# Patient Record
Sex: Female | Born: 1975 | Race: White | Hispanic: No | Marital: Married | Smoking: Former smoker
Health system: Southern US, Academic
[De-identification: ages and names within clinical notes are randomized; demographics above are authoritative.]

## PROBLEM LIST (undated history)

## (undated) DIAGNOSIS — K760 Fatty (change of) liver, not elsewhere classified: Secondary | ICD-10-CM

## (undated) DIAGNOSIS — E119 Type 2 diabetes mellitus without complications: Secondary | ICD-10-CM

## (undated) DIAGNOSIS — IMO0002 Reserved for concepts with insufficient information to code with codable children: Secondary | ICD-10-CM

## (undated) DIAGNOSIS — G4700 Insomnia, unspecified: Secondary | ICD-10-CM

## (undated) DIAGNOSIS — F32A Depression, unspecified: Secondary | ICD-10-CM

## (undated) DIAGNOSIS — J45909 Unspecified asthma, uncomplicated: Secondary | ICD-10-CM

## (undated) DIAGNOSIS — K3184 Gastroparesis: Secondary | ICD-10-CM

## (undated) DIAGNOSIS — M351 Other overlap syndromes: Secondary | ICD-10-CM

## (undated) DIAGNOSIS — E785 Hyperlipidemia, unspecified: Secondary | ICD-10-CM

## (undated) DIAGNOSIS — M797 Fibromyalgia: Secondary | ICD-10-CM

## (undated) DIAGNOSIS — E039 Hypothyroidism, unspecified: Secondary | ICD-10-CM

## (undated) DIAGNOSIS — I10 Essential (primary) hypertension: Secondary | ICD-10-CM

## (undated) DIAGNOSIS — M329 Systemic lupus erythematosus, unspecified: Secondary | ICD-10-CM

## (undated) DIAGNOSIS — Z8679 Personal history of other diseases of the circulatory system: Secondary | ICD-10-CM

## (undated) DIAGNOSIS — K769 Liver disease, unspecified: Secondary | ICD-10-CM

## (undated) DIAGNOSIS — K859 Acute pancreatitis without necrosis or infection, unspecified: Secondary | ICD-10-CM

## (undated) DIAGNOSIS — I7300 Raynaud's syndrome without gangrene: Secondary | ICD-10-CM

## (undated) DIAGNOSIS — G43909 Migraine, unspecified, not intractable, without status migrainosus: Secondary | ICD-10-CM

## (undated) DIAGNOSIS — K589 Irritable bowel syndrome without diarrhea: Secondary | ICD-10-CM

## (undated) DIAGNOSIS — F419 Anxiety disorder, unspecified: Secondary | ICD-10-CM

## (undated) HISTORY — DX: Liver disease, unspecified: K76.9

## (undated) HISTORY — DX: Gastroparesis: K31.84

## (undated) HISTORY — DX: Hyperlipidemia, unspecified: E78.5

## (undated) HISTORY — PX: ABDOMINAL HERNIA REPAIR: SHX539

## (undated) HISTORY — DX: Depression, unspecified: F32.A

## (undated) HISTORY — DX: Migraine, unspecified, not intractable, without status migrainosus: G43.909

## (undated) HISTORY — DX: Raynaud's syndrome without gangrene: I73.00

## (undated) HISTORY — PX: HX MENISCECTOMY: SHX123

## (undated) HISTORY — DX: Systemic lupus erythematosus, unspecified (CMS HCC): M32.9

## (undated) HISTORY — DX: Reserved for concepts with insufficient information to code with codable children: IMO0002

## (undated) HISTORY — PX: HX LUMBAR FUSION: SHX111

## (undated) HISTORY — PX: HX BREAST LUMPECTOMY: SHX2

## (undated) HISTORY — DX: Essential (primary) hypertension: I10

## (undated) HISTORY — DX: Type 2 diabetes mellitus without complications: E11.9

## (undated) HISTORY — DX: Fatty (change of) liver, not elsewhere classified: K76.0

## (undated) HISTORY — DX: Personal history of other diseases of the circulatory system: Z86.79

## (undated) HISTORY — PX: HX GALL BLADDER SURGERY/CHOLE: SHX55

## (undated) HISTORY — PX: ESOPHAGEAL DILATION: SHX303

## (undated) HISTORY — DX: Hypothyroidism, unspecified: E03.9

## (undated) HISTORY — PX: REPLACEMENT TOTAL KNEE: SUR1224

## (undated) HISTORY — DX: Acute pancreatitis without necrosis or infection, unspecified: K85.90

## (undated) HISTORY — DX: Type 2 diabetes mellitus without complications (CMS HCC): E11.9

## (undated) HISTORY — PX: HX TUBAL LIGATION: SHX77

## (undated) HISTORY — DX: Anxiety disorder, unspecified: F41.9

## (undated) HISTORY — DX: Unspecified asthma, uncomplicated: J45.909

## (undated) HISTORY — PX: HEEL SPUR SURGERY: SHX665

## (undated) HISTORY — DX: Irritable bowel syndrome, unspecified: K58.9

## (undated) HISTORY — PX: HX HYSTERECTOMY: SHX81

## (undated) HISTORY — DX: Insomnia, unspecified: G47.00

## (undated) HISTORY — DX: Fibromyalgia: M79.7

---
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
## 2019-08-16 IMAGING — CR XRAY LUMBAR SPINE COMPLETE
1 series · 5 of 5 positions shown · non-contrast
Comparison: None available.

EXAM:  XRAY LUMBAR SPINE COMPLETE
INDICATION: Back pain.

[Series 5: view not recorded · 0.17mm/px · 5 of 5 slices shown]
[im 1/5]
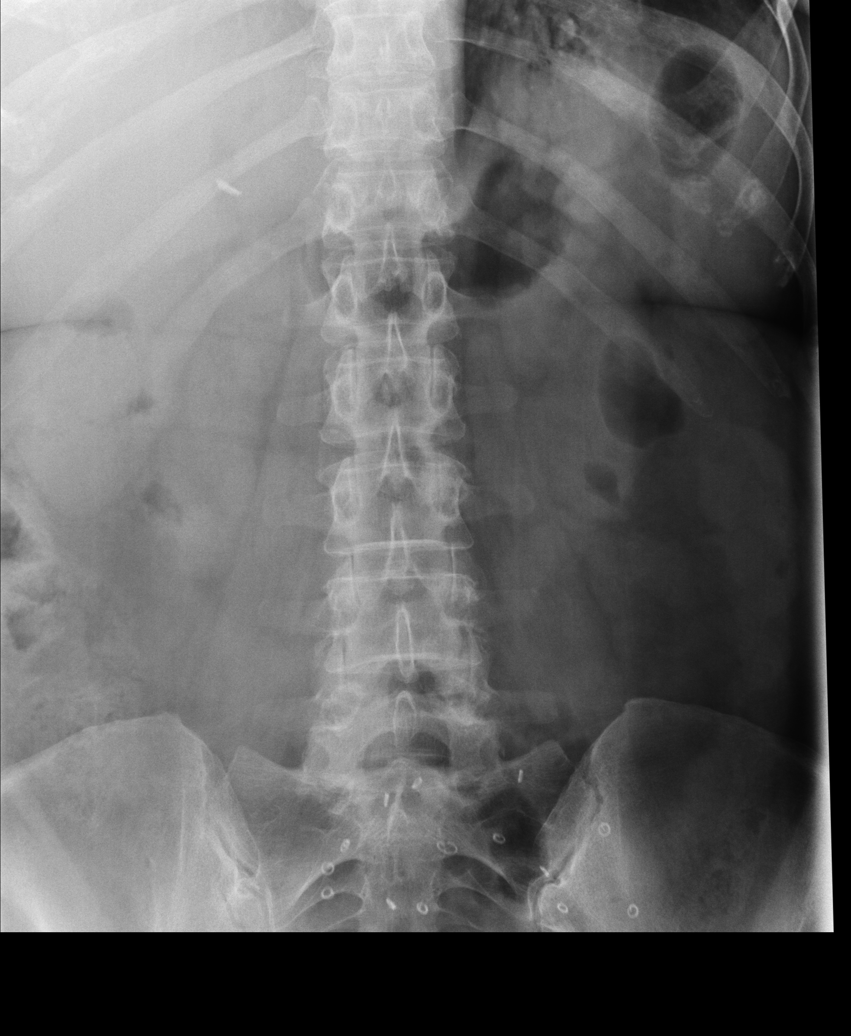
[im 2/5]
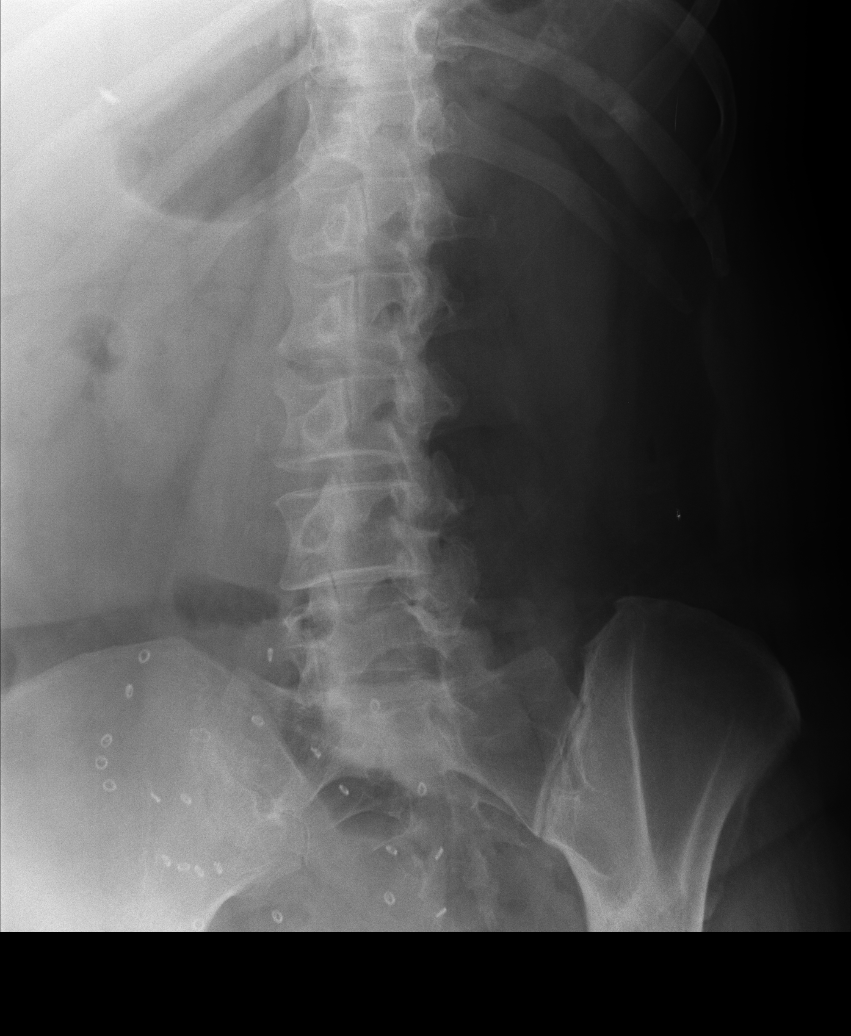
[im 3/5]
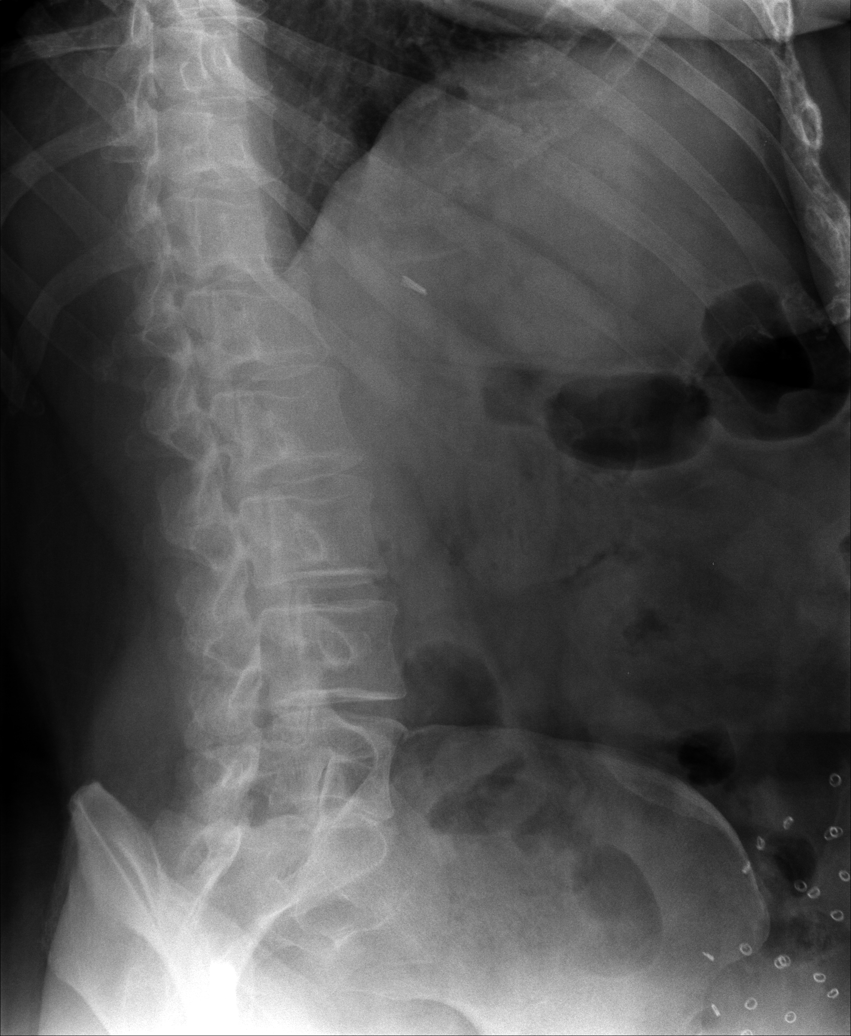
[im 4/5]
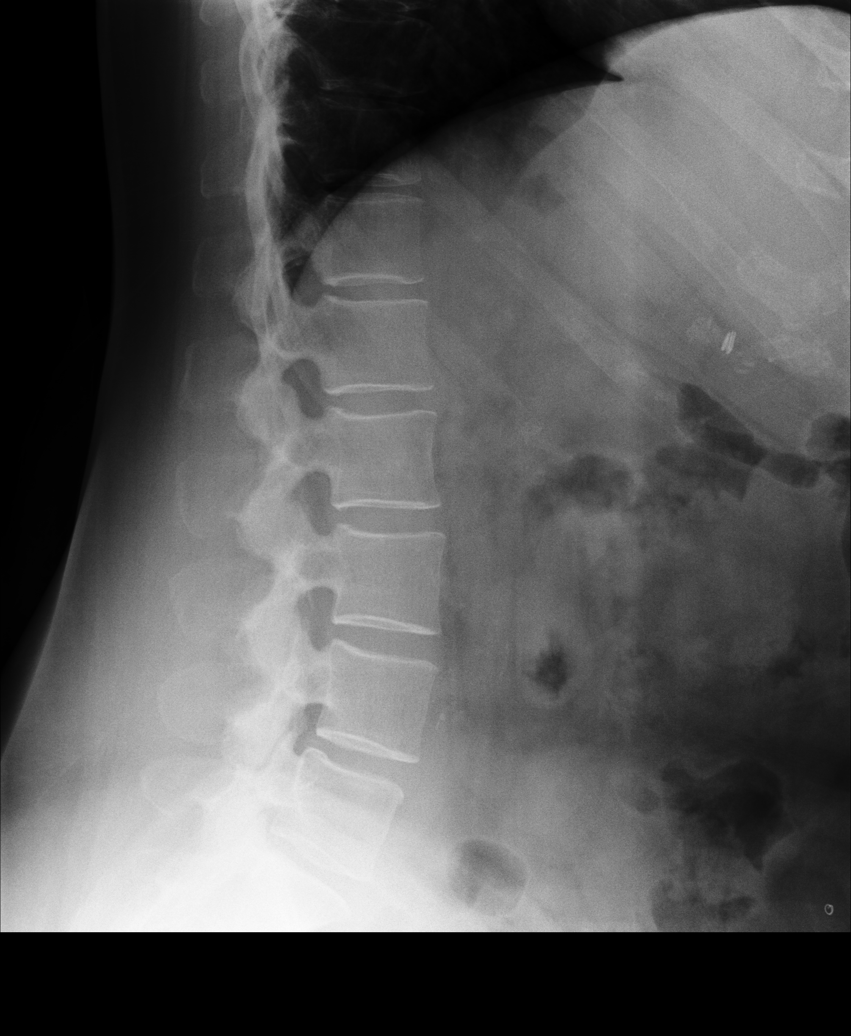
[im 5/5]
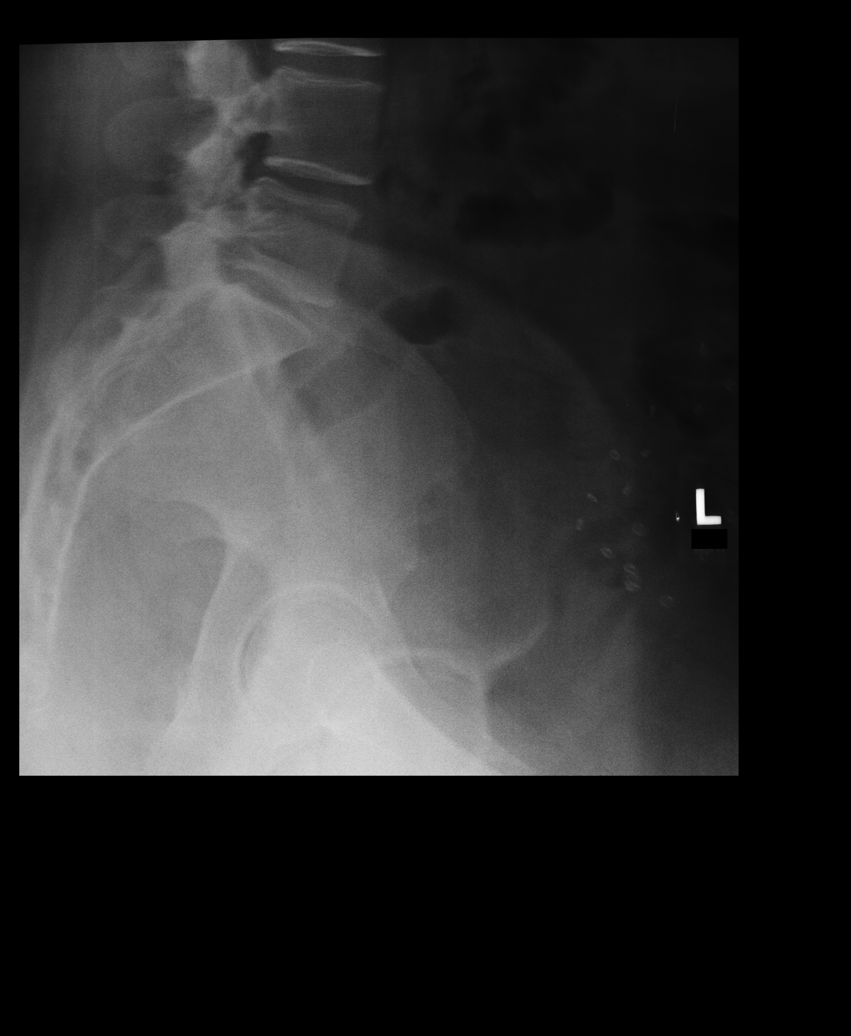

[5 of 5 positions shown; findings below may reference images not displayed]

FINDINGS: Vertebral bodies are normal in height and alignment. There is no acute fracture or subluxation. No significant disc space narrowing is seen at any level. There is moderate facet arthropathy within the lower lumbar spine. There is no definite pars defect on the oblique views. Paraspinal soft tissues are unremarkable. Cholecystectomy clips and ventral hernia mesh repair are also noted.
IMPRESSION: Moderate facet arthropathy within the lower lumbar spine.

## 2019-10-23 IMAGING — MR MRI LUMBAR SPINE WITHOUT CONTRAST
6 series · 43 of 48 positions shown · IV contrast (gadolinium)
Comparison: Radiographs dated 08/16/2019.

EXAM:  MRI LUMBAR SPINE WITHOUT CONTRAST
INDICATION: Chronic lower back pain.
TECHNIQUE: Multiplanar multisequential MRI of the lumbar spine was performed without gadolinium contrast.

[Series 9: STIR · sagittal · 4.0mm · 1.05mm/px · 3 of 13 slices shown]
[im 1/13]
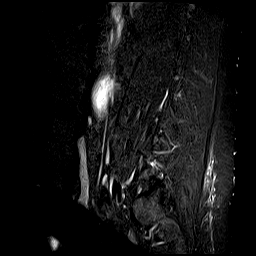
[im 4/13]
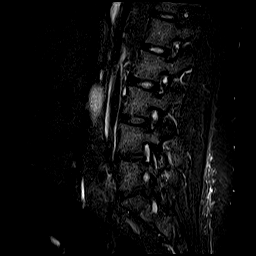
[im 7/13]
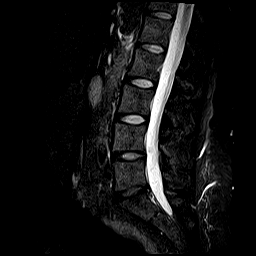

[Series 10: T2 · sagittal · 4.0mm · 0.94mm/px · 6 of 13 slices shown (1 of 3)]
[im 1/13]
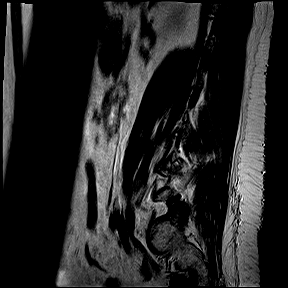
[im 3/13]
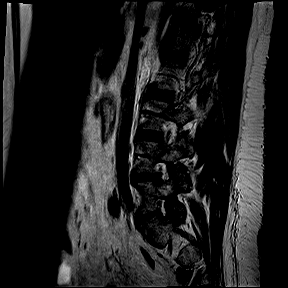
[im 5/13]
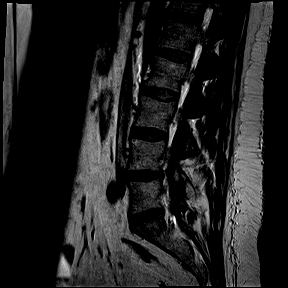
[im 8/13]
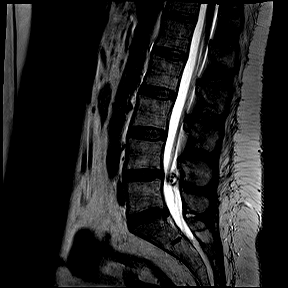
[im 10/13]
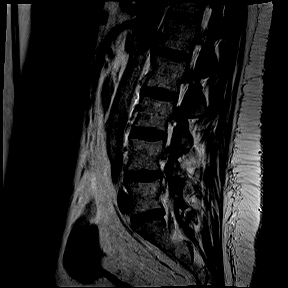
[im 13/13]
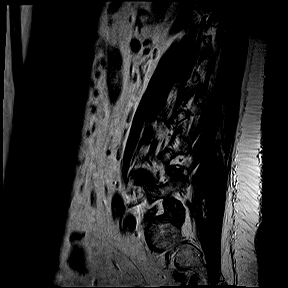

[Series 11: T1 · sagittal · 4.0mm · 0.94mm/px · 6 of 13 slices shown (1 of 2)]
[im 1/13]
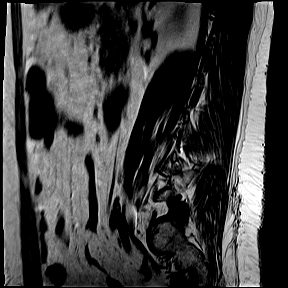
[im 3/13]
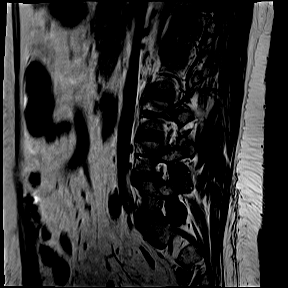
[im 5/13]
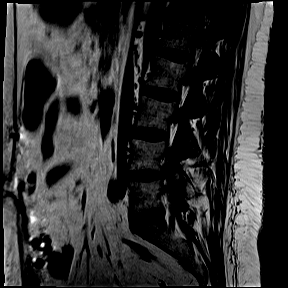
[im 8/13]
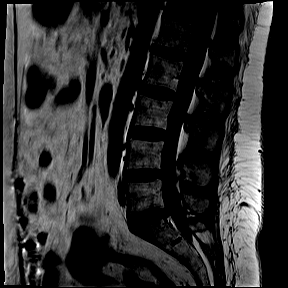
[im 10/13]
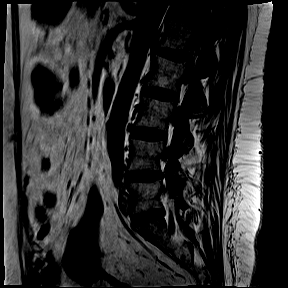
[im 13/13]
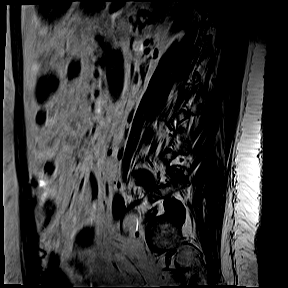

[Series 14: T2 · coronal · 4.0mm · 0.90mm/px · 9 of 20 slices shown (2 of 3)]
[im 1/20]
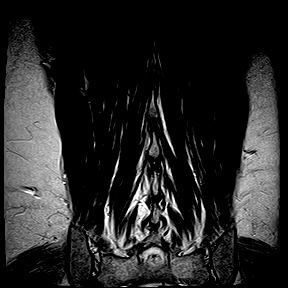
[im 3/20]
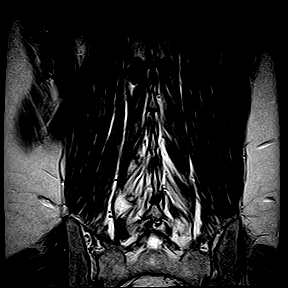
[im 5/20]
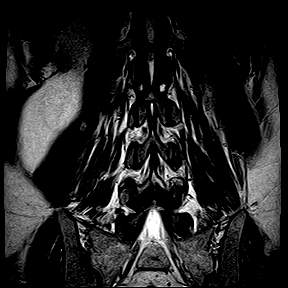
[im 8/20]
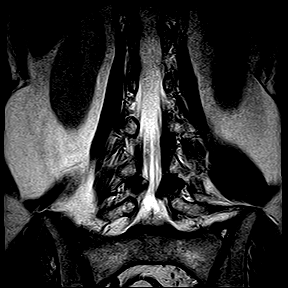
[im 10/20]
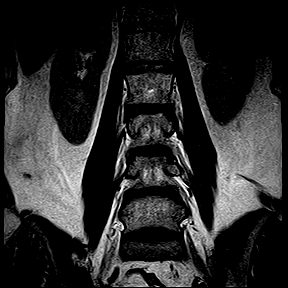
[im 12/20]
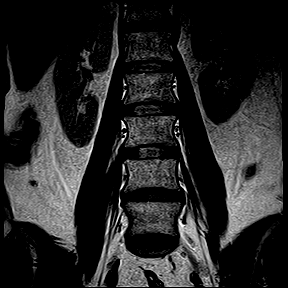
[im 15/20]
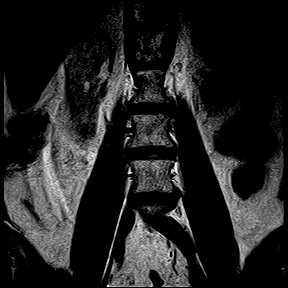
[im 17/20]
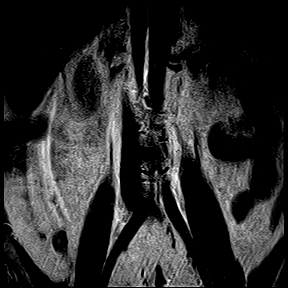
[im 20/20]
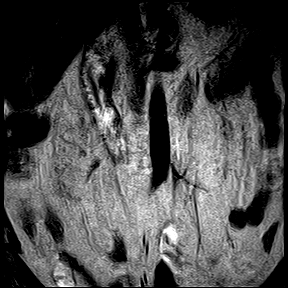

[Series 15: T1 · axial · 4.0mm · 0.47mm/px · z∈[-95,+117]mm · 8 of 23 slices shown (2 of 2)]
[im 1/23]
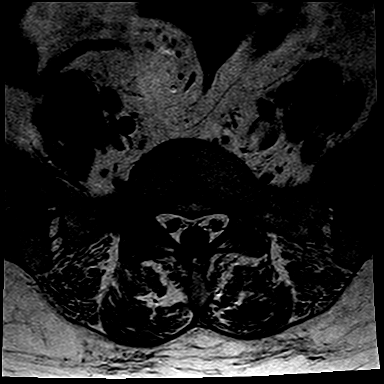
[im 5/23]
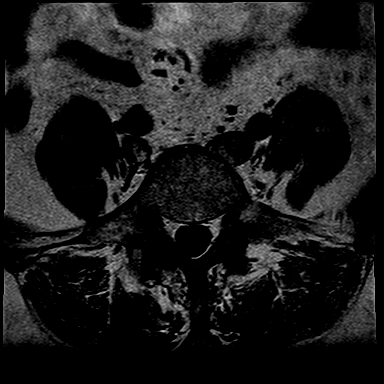
[im 7/23]
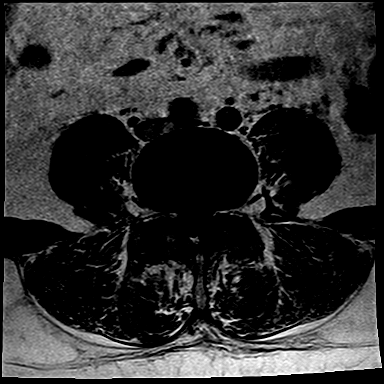
[im 9/23]
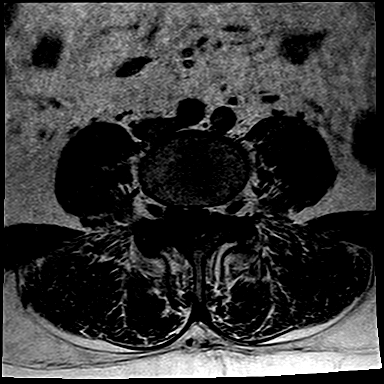
[im 14/23]
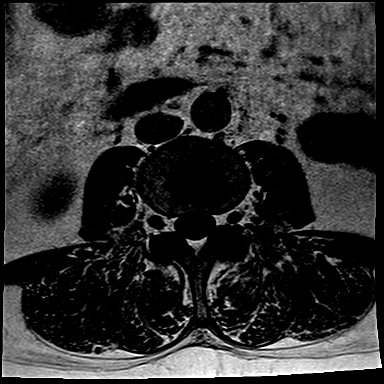
[im 16/23]
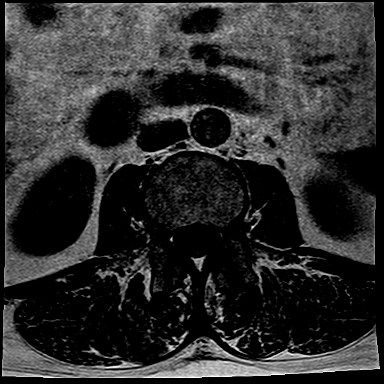
[im 18/23]
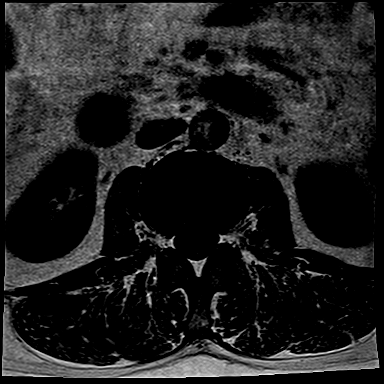
[im 23/23]
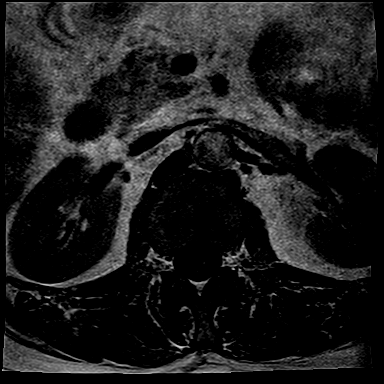

[Series 16: T2 · axial · 4.0mm · 0.47mm/px · z∈[-95,+117]mm · 11 of 23 slices shown (3 of 3)]
[im 1/23]
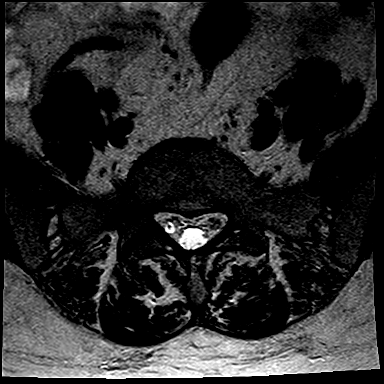
[im 3/23]
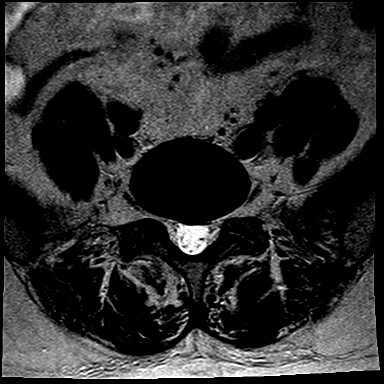
[im 5/23]
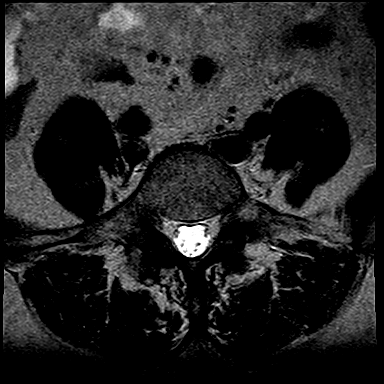
[im 7/23]
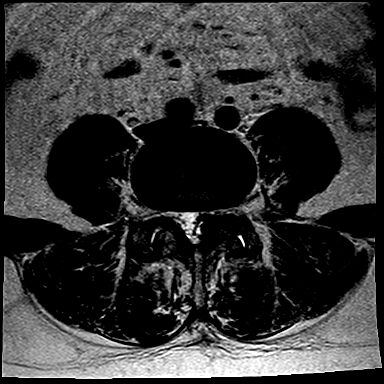
[im 9/23]
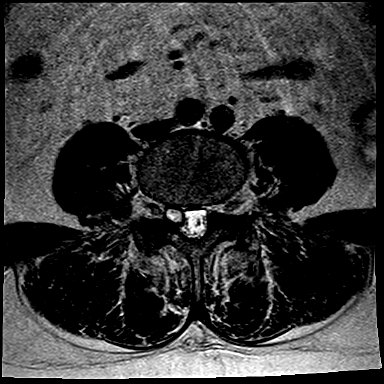
[im 12/23]
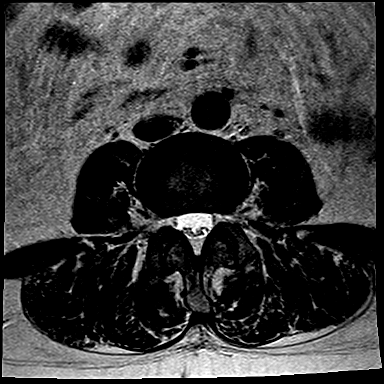
[im 14/23]
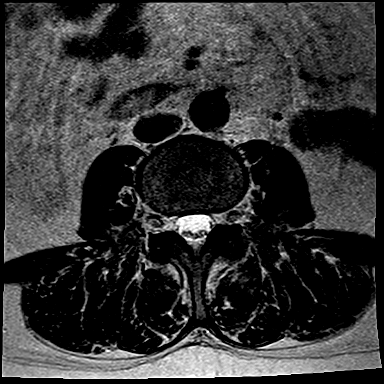
[im 16/23]
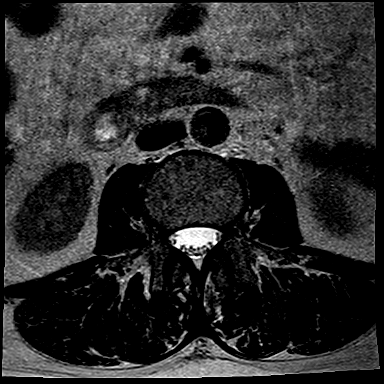
[im 18/23]
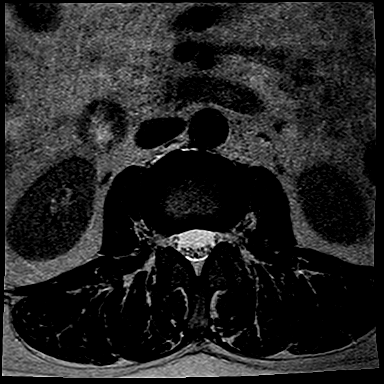
[im 20/23]
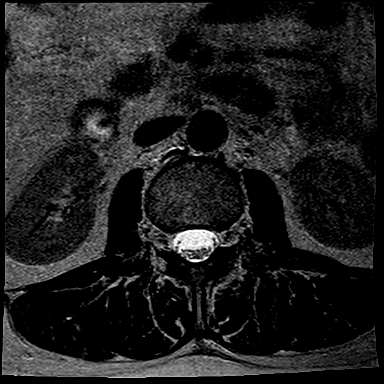
[im 23/23]
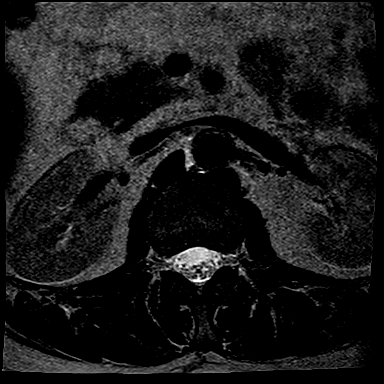

[43 of 48 positions shown; findings below may reference images not displayed]

FINDINGS: Vertebral bodies are normal in height, alignment and signal intensity. There is no acute fracture or subluxation. Distal spinal cord is normal in signal intensity and terminates normally at L1 vertebral body level. Spinal canal is congenitally narrow.

L1-2, L2-3 and L3-4 levels are unremarkable.

At L4-5 level, there is a minimal bulging annulus, minimally effacing the ventral thecal sac. There is severe lateral recess compression bilaterally from facet arthropathy. There is also a 9 mm synovial cyst associated with the right facet joint, contributing to severe right lateral recess compression. There is also moderate bilateral neural foraminal stenosis from facet arthropathy and bulging annulus.

At L5-S1 level, there is a small broad-based central disc bulge, mildly abutting the ventral thecal sac. There is mild-to-moderate bilateral neural foraminal stenosis from facet arthropathy.

Paraspinal soft tissues are unremarkable.
IMPRESSION: No significant disc herniation or spinal stenosis at any level. 

Advanced facet joint osteoarthritis bilaterally at L4-5 level. There is also a 9 mm synovial cyst associated with the right facet joint at this level. These findings result in severe lateral recess compression bilaterally at this level.

## 2019-11-26 ENCOUNTER — Ambulatory Visit (HOSPITAL_COMMUNITY): Payer: Self-pay

## 2021-03-31 ENCOUNTER — Encounter (INDEPENDENT_AMBULATORY_CARE_PROVIDER_SITE_OTHER): Payer: Self-pay | Admitting: HEMATOLOGY/ONCOLOGY

## 2021-04-16 ENCOUNTER — Other Ambulatory Visit: Payer: Self-pay

## 2021-04-16 ENCOUNTER — Ambulatory Visit (INDEPENDENT_AMBULATORY_CARE_PROVIDER_SITE_OTHER): Payer: Medicaid Other | Admitting: Family

## 2021-04-16 ENCOUNTER — Encounter (INDEPENDENT_AMBULATORY_CARE_PROVIDER_SITE_OTHER): Payer: Self-pay | Admitting: Family

## 2021-04-16 VITALS — BP 129/84 | HR 109 | Temp 97.3°F | Ht 71.5 in | Wt 233.7 lb

## 2021-04-16 DIAGNOSIS — F32A Depression, unspecified: Secondary | ICD-10-CM

## 2021-04-16 DIAGNOSIS — K760 Fatty (change of) liver, not elsewhere classified: Secondary | ICD-10-CM

## 2021-04-16 DIAGNOSIS — E119 Type 2 diabetes mellitus without complications: Secondary | ICD-10-CM

## 2021-04-16 DIAGNOSIS — Z8679 Personal history of other diseases of the circulatory system: Secondary | ICD-10-CM

## 2021-04-16 DIAGNOSIS — J45909 Unspecified asthma, uncomplicated: Secondary | ICD-10-CM

## 2021-04-16 DIAGNOSIS — I7300 Raynaud's syndrome without gangrene: Secondary | ICD-10-CM

## 2021-04-16 DIAGNOSIS — E785 Hyperlipidemia, unspecified: Secondary | ICD-10-CM

## 2021-04-16 DIAGNOSIS — I10 Essential (primary) hypertension: Secondary | ICD-10-CM

## 2021-04-16 DIAGNOSIS — M329 Systemic lupus erythematosus, unspecified: Secondary | ICD-10-CM

## 2021-04-16 DIAGNOSIS — K3184 Gastroparesis: Secondary | ICD-10-CM

## 2021-04-16 DIAGNOSIS — D72829 Elevated white blood cell count, unspecified: Secondary | ICD-10-CM

## 2021-04-16 DIAGNOSIS — Z6832 Body mass index (BMI) 32.0-32.9, adult: Secondary | ICD-10-CM

## 2021-04-16 DIAGNOSIS — G43909 Migraine, unspecified, not intractable, without status migrainosus: Secondary | ICD-10-CM

## 2021-04-16 DIAGNOSIS — K859 Acute pancreatitis without necrosis or infection, unspecified: Secondary | ICD-10-CM

## 2021-04-16 DIAGNOSIS — G4700 Insomnia, unspecified: Secondary | ICD-10-CM

## 2021-04-16 DIAGNOSIS — F419 Anxiety disorder, unspecified: Secondary | ICD-10-CM

## 2021-04-16 DIAGNOSIS — M797 Fibromyalgia: Secondary | ICD-10-CM

## 2021-04-16 DIAGNOSIS — E039 Hypothyroidism, unspecified: Secondary | ICD-10-CM

## 2021-04-16 DIAGNOSIS — D7289 Other specified disorders of white blood cells: Secondary | ICD-10-CM

## 2021-04-16 NOTE — H&P (Unsigned)
HEMATOLOGY/ONCOLOGY, [facility]  [place], [place] [address]      New Patient History and Physical     Name: Kathy Gibson MRN:  [id number]   Date: 04/16/2021 Age: 45 y.o. DOB: 1976/04/18       Referring Physician: Cam Hai, FNP  Primary Care Provider: Cam Hai, FNP    Reason for visit/consultation:   Abnormal results (Elevated white count )        History of Present Illness:  The patient is a 45 year old white female that was referred to [place] for leukocytosis.  Patient states she was 1st aware that her WBCs were elevated last year.  She states at that time her WBCs was 12.8.  Patient states that she has had poor dentition for years and just had all her teeth removed last week and is preparing to be fitted for dentures.  Patient states she had labs done in May 2022 which revealed an elevated WBC count 14.1.  Patient states she has had no follow-up CBC done since that time and since she has had her oral surgery performed.  She denies any fevers, sweats, or weight loss.  She is a former smoker.  She denies any recent use of steroids.  She denies any history of blood disorders personally or in her family.        Past Medical History  Past Medical History:   Diagnosis Date   . Anxiety and depression    . Asthma    . Diabetes mellitus, type 2 (CMS HCC)    . Essential hypertension    . Fatty liver    . Fibromyalgia    . Gastroparesis    . History of angina    . Hyperlipidemia    . Hypothyroidism    . IBS (irritable bowel syndrome)    . Insomnia    . Liver disease     fatty liver   . Lupus (CMS HCC)    . Migraines    . Pancreatitis    . Raynaud disease       Past Surgical History:   Procedure Laterality Date   . ABDOMINAL HERNIA REPAIR     . CESAREAN SECTION     . ESOPHAGEAL DILATION     . HEEL SPUR SURGERY     . HX BREAST LUMPECTOMY Left     benign   . HX CHOLECYSTECTOMY     . HX HYSTERECTOMY     . HX LUMBAR FUSION     . HX MENISCECTOMY     .  HX TUBAL LIGATION     . REPLACEMENT TOTAL KNEE        Family Medical History:     Problem Relation (Age of Onset)    Breast Cancer Maternal Grandmother, Paternal Grandmother    Cervical Cancer Mother    Heart Attack Paternal Grandmother    Stroke Maternal Grandmother          Social History  Occupation:   Social History     Occupational History   . Not on file    [place] [id number]  Social History     Socioeconomic History   . Marital status: Married   Tobacco Use   . Smoking status: Former Smoker     Packs/day: 0.50     Years: 12.00     Pack years: 6.00     Types: Cigarettes     Quit date:  2017     Years since quitting: 5.4   . Smokeless tobacco: Never Used   Vaping Use   . Vaping Use: Former   . Substances: THC   Substance and Sexual Activity   . Alcohol use: Not Currently   . Drug use: Yes     Types: Marijuana     Comment: legal pen to help with teeth pain       Allergies   Allergen Reactions   . Cefdinir    . Demerol [Meperidine]    . Doxycycline    . Morphine    . Nsaids (Non-Steroidal Anti-Inflammatory Drug)      Current Outpatient Medications   Medication Sig   . ACCU-CHEK GUIDE TEST STRIPS Does not apply Strip    . ACCU-CHEK SOFTCLIX LANCETS Misc    . amLODIPine (NORVASC) 5 mg Oral Tablet    . aspirin (ECOTRIN) 81 mg Oral Tablet, Delayed Release (E.C.)    . atorvastatin (LIPITOR) 40 mg Oral Tablet    . BD INSULIN PEN NEEDLE UF 31 gauge x 5/16" Needle    . bethanechol chloride (URECHOLINE) 5 mg Oral Tablet    . clonazePAM (KLONOPIN) 0.5 mg Oral Tablet    . cyanocobalamin (VITAMIN B12) 1,000 mcg/mL Injection Solution    . dicyclomine (BENTYL) 10 mg Oral Capsule    . DULoxetine (CYMBALTA DR) 30 mg Oral Capsule, Delayed Release(E.C.)    . ergocalciferol, vitamin D2, (DRISDOL) 1,250 mcg (50,000 unit) Oral Capsule    . famotidine (PEPCID) 40 mg Oral Tablet    . FREESTYLE LANCETS 28 gauge Misc    . hydrOXYchloroQUINE (PLAQUENIL) 200 mg Oral Tablet    . isosorbide mononitrate (IMDUR) 30 mg Oral Tablet  Sustained Release 24 hr    . JARDIANCE 25 mg Oral Tablet    . LANTUS SOLOSTAR U-100 INSULIN 100 unit/mL (3 mL) Subcutaneous Insulin Pen    . levothyroxine (SYNTHROID) 150 mcg Oral Tablet    . Magnesium Gluconate (MAG-G) 27 mg magnesium (500 mg) Oral Tablet Take by mouth   . MetFORMIN (GLUCOPHAGE) 1,000 mg Oral Tablet    . metoprolol succinate (TOPROL-XL) 25 mg Oral Tablet Sustained Release 24 hr    . NANO PEN NEEDLE 32 gauge x 5/32" Needle    . nitroGLYCERIN (NITROSTAT) 0.4 mg Sublingual Tablet, Sublingual    . NOVOLOG FLEXPEN U-100 INSULIN 100 unit/mL (3 mL) Subcutaneous Insulin Pen    . omeprazole (PRILOSEC) 40 mg Oral Capsule, Delayed Release(E.C.)    . TRULICITY 3 CH/8.8 mL Subcutaneous Pen Injector    . ZENPEP 40,000-126,000- 168,000 unit Oral Capsule, Delayed Release(E.C.)        ROS:   Review of Systems   Constitutional: Positive for fatigue.   HENT:   Positive for sore throat and trouble swallowing.    Cardiovascular: Positive for chest pain.   Gastrointestinal: Positive for nausea and vomiting.   Endocrine: Positive for hot flashes.   Musculoskeletal: Positive for arthralgias.   Neurological: Positive for dizziness.       Physical Examination:  BP 129/84 (Site: Right, Patient Position: Sitting, Cuff Size: Adult)   Pulse (!) 109   Temp 36.3 C (97.3 F) (Skin)   Ht 1.816 m (5' 11.5")   Wt 106 kg (233 lb 11.2 oz)   BMI 32.14 kg/m       Physical Exam  Vitals and nursing note reviewed.   Constitutional:       Appearance: Normal appearance.   HENT:  Head: Normocephalic.      Mouth/Throat:      Mouth: Mucous membranes are moist.   Eyes:      General: No scleral icterus.     Extraocular Movements: Extraocular movements intact.   Cardiovascular:      Rate and Rhythm: Normal rate and regular rhythm.      Heart sounds: Normal heart sounds, S1 normal and S2 normal.   Pulmonary:      Effort: Pulmonary effort is normal.      Breath sounds: Normal breath sounds.   Abdominal:      General: Bowel sounds are  normal.      Palpations: Abdomen is soft.   Musculoskeletal:      Cervical back: Normal range of motion and neck supple. No bony tenderness.      Thoracic back: No bony tenderness.      Lumbar back: No bony tenderness.   Lymphadenopathy:      Cervical: No cervical adenopathy.      Comments: No supraclavicular adenopathy   Skin:     General: Skin is warm and dry.   Neurological:      Mental Status: She is alert.      Motor: Motor function is intact.      Coordination: Coordination is intact.      Gait: Gait is intact.   Psychiatric:         Mood and Affect: Mood and affect normal.         Speech: Speech normal.         Behavior: Behavior is cooperative.         Thought Content: Thought content normal.         Cognition and Memory: Cognition and memory normal.          Data reviewed:   Labs from 11/28/2019:  WBC 9.3, hemoglobin 13.1, platelets 319000, neutrophils 63, lymphs 29, monos 4, eos 3, baso 1, CRP 18, ANA positive total bili 0.3, alk-phos 168, AST 19, ALT 20    Labs from 02/25/2021:  WBC 14.1, hemoglobin 14.0, platelets 409000, neutrophils 9.6, absolute lymphs 3.3, monos 0.9, eos 0.2, basos 0.1 total bili 0.4, alk-phos 158, AST 18, ALT 19      Assessment/Plan:   Problem List Items Addressed This Visit        Hematologic/Lymphatic    Leucocytosis - Primary     We will send patient for a CBC with diff, CMP, lap score, sed rate, CRP, and ultrasound of the spleen.  We will ask pathology to review the peripheral smear.  Most recently the reagent for the lap score has been on national back order.  We will attempt to pre-cert a bcr-ABL with her insurance if that remains the case.  At this time leukocytosis appears to be related to her poor dentition.  She has had all her teeth removed and is currently waiting on dentures.  We will see if her WBC has improved with the labs she is having drawn today.  We will plan to see her in follow-up in 5 weeks to review the results and discuss the next plan of care.            Relevant Orders    CBC/DIFF    COMPREHENSIVE METABOLIC PANEL, NON-FASTING    LEUKOCYTE ALKALINE PHOSPHATASE    SEDIMENTATION RATE    C-REACTIVE PROTEIN(CRP),INFLAMMATION    GIEMSA STAIN, WITH PATHOLOGIST REVIEW    US SPLEEN    Other specified disorders of white  blood cells    Relevant Orders    BCR-ABL1 GENE REARRANGEMENT QUANT PCR    Lupus (CMS HCC)       Cardiovascular System    Essential hypertension    History of angina    Raynaud disease    Hyperlipidemia       Respiratory    Asthma       Neurologic    Fibromyalgia    Migraines    Insomnia       Digestive    Fatty liver    Gastroparesis       Endocrine    Diabetes mellitus, type 2 (CMS HCC)    Pancreatitis    Hypothyroidism       Psychiatric    Anxiety and depression          Return in about 5 weeks (around 05/21/2021) for In Person Visit.    Caralina Nop Environmental education officer, FNP-C      CC:  Lilia Pro, FNP  [address]  [place] [address]      Portions of this note may be dictated using voice recognition software or a dictation service. Variances in spelling and vocabulary are possible and unintentional. Not all errors are caught/corrected. Please notify the [facility] if any discrepancies are noted or if the meaning of any statement is not clear.

## 2021-04-16 NOTE — Nursing Note (Signed)
NP referral for elevated white count

## 2021-04-20 ENCOUNTER — Encounter (INDEPENDENT_AMBULATORY_CARE_PROVIDER_SITE_OTHER): Payer: Self-pay | Admitting: Family

## 2021-04-20 DIAGNOSIS — I10 Essential (primary) hypertension: Secondary | ICD-10-CM | POA: Insufficient documentation

## 2021-04-20 DIAGNOSIS — E785 Hyperlipidemia, unspecified: Secondary | ICD-10-CM | POA: Insufficient documentation

## 2021-04-20 DIAGNOSIS — K760 Fatty (change of) liver, not elsewhere classified: Secondary | ICD-10-CM | POA: Insufficient documentation

## 2021-04-20 DIAGNOSIS — G43909 Migraine, unspecified, not intractable, without status migrainosus: Secondary | ICD-10-CM | POA: Insufficient documentation

## 2021-04-20 DIAGNOSIS — D72829 Elevated white blood cell count, unspecified: Secondary | ICD-10-CM | POA: Insufficient documentation

## 2021-04-20 DIAGNOSIS — K859 Acute pancreatitis without necrosis or infection, unspecified: Secondary | ICD-10-CM | POA: Insufficient documentation

## 2021-04-20 DIAGNOSIS — Z8679 Personal history of other diseases of the circulatory system: Secondary | ICD-10-CM | POA: Insufficient documentation

## 2021-04-20 DIAGNOSIS — K3184 Gastroparesis: Secondary | ICD-10-CM | POA: Insufficient documentation

## 2021-04-20 DIAGNOSIS — E119 Type 2 diabetes mellitus without complications: Secondary | ICD-10-CM | POA: Insufficient documentation

## 2021-04-20 DIAGNOSIS — E7849 Other hyperlipidemia: Secondary | ICD-10-CM | POA: Insufficient documentation

## 2021-04-20 DIAGNOSIS — M329 Systemic lupus erythematosus, unspecified: Secondary | ICD-10-CM | POA: Insufficient documentation

## 2021-04-20 DIAGNOSIS — E039 Hypothyroidism, unspecified: Secondary | ICD-10-CM | POA: Insufficient documentation

## 2021-04-20 DIAGNOSIS — F32A Depression, unspecified: Secondary | ICD-10-CM | POA: Insufficient documentation

## 2021-04-20 DIAGNOSIS — D7289 Other specified disorders of white blood cells: Secondary | ICD-10-CM | POA: Insufficient documentation

## 2021-04-20 DIAGNOSIS — I7300 Raynaud's syndrome without gangrene: Secondary | ICD-10-CM | POA: Insufficient documentation

## 2021-04-20 DIAGNOSIS — J45909 Unspecified asthma, uncomplicated: Secondary | ICD-10-CM | POA: Insufficient documentation

## 2021-04-20 DIAGNOSIS — Z794 Long term (current) use of insulin: Secondary | ICD-10-CM | POA: Insufficient documentation

## 2021-04-20 DIAGNOSIS — G4700 Insomnia, unspecified: Secondary | ICD-10-CM | POA: Insufficient documentation

## 2021-04-20 DIAGNOSIS — M797 Fibromyalgia: Secondary | ICD-10-CM | POA: Insufficient documentation

## 2021-04-20 NOTE — Assessment & Plan Note (Addendum)
We will send patient for a CBC with diff, CMP, lap score, sed rate, CRP, and ultrasound of the spleen.  We will ask pathology to review the peripheral smear.  Most recently the reagent for the lap score has been on national back order.  We will attempt to pre-cert a bcr-ABL with her insurance if that remains the case.  At this time leukocytosis appears to be related to her poor dentition.  She has had all her teeth removed and is currently waiting on dentures.  We will see if her WBC has improved with the labs she is having drawn today.  We will plan to see her in follow-up in 5 weeks to review the results and discuss the next plan of care.

## 2021-04-21 ENCOUNTER — Ambulatory Visit (INDEPENDENT_AMBULATORY_CARE_PROVIDER_SITE_OTHER): Payer: Self-pay | Admitting: Family

## 2021-05-28 ENCOUNTER — Ambulatory Visit (INDEPENDENT_AMBULATORY_CARE_PROVIDER_SITE_OTHER): Payer: Medicaid Other | Admitting: Family

## 2021-05-28 ENCOUNTER — Other Ambulatory Visit: Payer: Self-pay

## 2021-05-28 ENCOUNTER — Encounter (INDEPENDENT_AMBULATORY_CARE_PROVIDER_SITE_OTHER): Payer: Self-pay | Admitting: Family

## 2021-05-28 VITALS — BP 111/67 | HR 100 | Temp 97.2°F | Ht 71.5 in | Wt 235.6 lb

## 2021-05-28 DIAGNOSIS — M797 Fibromyalgia: Secondary | ICD-10-CM

## 2021-05-28 DIAGNOSIS — G43909 Migraine, unspecified, not intractable, without status migrainosus: Secondary | ICD-10-CM

## 2021-05-28 DIAGNOSIS — K3184 Gastroparesis: Secondary | ICD-10-CM

## 2021-05-28 DIAGNOSIS — I7300 Raynaud's syndrome without gangrene: Secondary | ICD-10-CM

## 2021-05-28 DIAGNOSIS — F419 Anxiety disorder, unspecified: Secondary | ICD-10-CM

## 2021-05-28 DIAGNOSIS — K859 Acute pancreatitis without necrosis or infection, unspecified: Secondary | ICD-10-CM

## 2021-05-28 DIAGNOSIS — F32A Depression, unspecified: Secondary | ICD-10-CM

## 2021-05-28 DIAGNOSIS — Z6832 Body mass index (BMI) 32.0-32.9, adult: Secondary | ICD-10-CM

## 2021-05-28 DIAGNOSIS — I10 Essential (primary) hypertension: Secondary | ICD-10-CM

## 2021-05-28 DIAGNOSIS — K589 Irritable bowel syndrome without diarrhea: Secondary | ICD-10-CM

## 2021-05-28 DIAGNOSIS — J45909 Unspecified asthma, uncomplicated: Secondary | ICD-10-CM

## 2021-05-28 DIAGNOSIS — D72829 Elevated white blood cell count, unspecified: Secondary | ICD-10-CM

## 2021-05-28 DIAGNOSIS — E039 Hypothyroidism, unspecified: Secondary | ICD-10-CM

## 2021-05-28 DIAGNOSIS — K760 Fatty (change of) liver, not elsewhere classified: Secondary | ICD-10-CM

## 2021-05-28 DIAGNOSIS — Z8679 Personal history of other diseases of the circulatory system: Secondary | ICD-10-CM

## 2021-05-28 DIAGNOSIS — E785 Hyperlipidemia, unspecified: Secondary | ICD-10-CM

## 2021-05-28 DIAGNOSIS — E119 Type 2 diabetes mellitus without complications: Secondary | ICD-10-CM

## 2021-05-28 DIAGNOSIS — M329 Systemic lupus erythematosus, unspecified: Secondary | ICD-10-CM

## 2021-05-28 NOTE — Nursing Note (Signed)
Pt here for a follow up

## 2021-05-29 ENCOUNTER — Encounter (INDEPENDENT_AMBULATORY_CARE_PROVIDER_SITE_OTHER): Payer: Self-pay | Admitting: Family

## 2021-05-29 DIAGNOSIS — K589 Irritable bowel syndrome without diarrhea: Secondary | ICD-10-CM | POA: Insufficient documentation

## 2021-05-29 NOTE — Cancer Center Note (Signed)
HEMATOLOGY/ONCOLOGY, [place], [place] [address]      New Patient History and Physical     Name: Kathy Gibson MRN:  [id number]   Date: 05/28/2021 Age: 45 y.o. DOB: 04-15-1976       Referring Physician: Cam Hai, FNP  Primary Care Provider: Cam Hai, FNP    Reason for visit/consultation:   Leukocytosis        History of Present Illness:    The patient is being seen today in follow-up with a diagnosis of leukocytosis.  Patient voices no complaints today.  She denies any fevers, night sweats, weight loss.    ROS:   Negative except what is addressed in HPI.    Past Medical History  Past Medical History:   Diagnosis Date   . Anxiety and depression    . Asthma    . Diabetes mellitus, type 2 (CMS HCC)    . Essential hypertension    . Fatty liver    . Fibromyalgia    . Gastroparesis    . History of angina    . Hyperlipidemia    . Hypothyroidism    . IBS (irritable bowel syndrome)    . Insomnia    . Liver disease     fatty liver   . Lupus (CMS HCC)    . Migraines    . Pancreatitis    . Raynaud disease       Past Surgical History:   Procedure Laterality Date   . ABDOMINAL HERNIA REPAIR     . CESAREAN SECTION     . ESOPHAGEAL DILATION     . HEEL SPUR SURGERY     . HX BREAST LUMPECTOMY Left     benign   . HX CHOLECYSTECTOMY     . HX HYSTERECTOMY     . HX LUMBAR FUSION     . HX MENISCECTOMY     . HX TUBAL LIGATION     . REPLACEMENT TOTAL KNEE        Family Medical History:     Problem Relation (Age of Onset)    Breast Cancer Maternal Grandmother, Paternal Grandmother    Cervical Cancer Mother    Heart Attack Paternal Grandmother    Stroke Maternal Grandmother          Social History  Occupation:   Social History     Occupational History   . Not on file    [place] [phone]  Social History     Socioeconomic History   . Marital status: Married   Tobacco Use   . Smoking status: Former Smoker     Packs/day: 0.50     Years: 12.00      Pack years: 6.00     Types: Cigarettes     Quit date: 2017     Years since quitting: 5.5   . Smokeless tobacco: Never Used   Vaping Use   . Vaping Use: Former   . Substances: THC   Substance and Sexual Activity   . Alcohol use: Not Currently   . Drug use: Yes     Types: Marijuana     Comment: legal pen to help with teeth pain       Allergies   Allergen Reactions   . Cefdinir    . Demerol [Meperidine]    . Doxycycline    . Morphine    . Nsaids (Non-Steroidal Anti-Inflammatory Drug)  Current Outpatient Medications   Medication Sig   . ACCU-CHEK GUIDE TEST STRIPS Does not apply Strip    . ACCU-CHEK SOFTCLIX LANCETS Misc    . amLODIPine (NORVASC) 5 mg Oral Tablet    . aspirin (ECOTRIN) 81 mg Oral Tablet, Delayed Release (E.C.)    . atorvastatin (LIPITOR) 40 mg Oral Tablet    . BD INSULIN PEN NEEDLE UF 31 gauge x 5/16" Needle    . bethanechol chloride (URECHOLINE) 5 mg Oral Tablet    . clonazePAM (KLONOPIN) 0.5 mg Oral Tablet    . cyanocobalamin (VITAMIN B12) 1,000 mcg/mL Injection Solution    . dicyclomine (BENTYL) 10 mg Oral Capsule    . DULoxetine (CYMBALTA DR) 30 mg Oral Capsule, Delayed Release(E.C.) 30 mg Once a day   . ergocalciferol, vitamin D2, (DRISDOL) 1,250 mcg (50,000 unit) Oral Capsule    . famotidine (PEPCID) 40 mg Oral Tablet    . FREESTYLE LANCETS 28 gauge Misc    . gabapentin (NEURONTIN) 300 mg Oral Capsule    . hydrOXYchloroQUINE (PLAQUENIL) 200 mg Oral Tablet Twice daily   . isosorbide mononitrate (IMDUR) 30 mg Oral Tablet Sustained Release 24 hr    . JARDIANCE 25 mg Oral Tablet    . LANTUS SOLOSTAR U-100 INSULIN 100 unit/mL (3 mL) Subcutaneous Insulin Pen    . levothyroxine (SYNTHROID) 150 mcg Oral Tablet    . Magnesium Gluconate 27 mg magnesium (500 mg) Oral Tablet Take by mouth   . MetFORMIN (GLUCOPHAGE) 1,000 mg Oral Tablet    . metoprolol succinate (TOPROL-XL) 25 mg Oral Tablet Sustained Release 24 hr    . NANO PEN NEEDLE 32 gauge x 5/32" Needle    . nitroGLYCERIN (NITROSTAT) 0.4 mg  Sublingual Tablet, Sublingual    . NOVOLOG FLEXPEN U-100 INSULIN 100 unit/mL (3 mL) Subcutaneous Insulin Pen    . omeprazole (PRILOSEC) 40 mg Oral Capsule, Delayed Release(E.C.)    . TRULICITY 3 MW/1.0 mL Subcutaneous Pen Injector    . ZENPEP 40,000-126,000- 168,000 unit Oral Capsule, Delayed Release(E.C.)            Physical Examination:  BP 111/67 (Site: Right, Patient Position: Sitting, Cuff Size: Adult)   Pulse 100   Temp 36.2 C (97.2 F) (Skin)   Ht 1.816 m (5' 11.5")   Wt 107 kg (235 lb 9.6 oz)   BMI 32.40 kg/m       Physical Exam  Vitals and nursing note reviewed.   Constitutional:       Appearance: Normal appearance.   HENT:      Head: Normocephalic.      Mouth/Throat:      Mouth: Mucous membranes are moist.   Eyes:      General: No scleral icterus.     Extraocular Movements: Extraocular movements intact.   Cardiovascular:      Rate and Rhythm: Normal rate and regular rhythm.      Heart sounds: Normal heart sounds, S1 normal and S2 normal.   Pulmonary:      Effort: Pulmonary effort is normal.      Breath sounds: Normal breath sounds.   Abdominal:      General: Bowel sounds are normal.      Palpations: Abdomen is soft.   Musculoskeletal:      Cervical back: Normal range of motion and neck supple. No bony tenderness.      Thoracic back: No bony tenderness.      Lumbar back: No bony tenderness.   Lymphadenopathy:  Cervical: No cervical adenopathy.      Comments: No supraclavicular adenopathy   Skin:     General: Skin is warm and dry.   Neurological:      Mental Status: She is alert.      Motor: Motor function is intact.      Coordination: Coordination is intact.      Gait: Gait is intact.   Psychiatric:         Mood and Affect: Mood and affect normal.         Speech: Speech normal.         Behavior: Behavior is cooperative.         Thought Content: Thought content normal.         Cognition and Memory: Cognition and memory normal.          Data reviewed:   Labs from 04/17/2021:  WBC 10.8, hemoglobin  12.9, platelets 406000, differential within normal limits.  Lap score 96, bcr not detected, TSH 0.75, BUN 11, creatinine 0.9, total bili 0.5, AST 16, ALT 25, alk-phos 128, CRP 0.9, ESR 6.  Peripheral smear report from 04/17/2021: rare elliptocytes seen.  Otherwise normal appearing peripheral smear.    Spleen sonogram from 05/01/2021:  There is a hyperechoic lesion in the inferior pole of the spleen measuring up to about 14 mm.  This corresponds to a hypodense lesion in the inferior spleen on the 2020 CT.  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  Spleen length of 12.4 cm is borderline prominent.      Assessment/Plan:   Problem List Items Addressed This Visit        Hematologic/Lymphatic    Leucocytosis - Primary     Reactive leukocytosis in a former smoker and recent removal of teeth due to dentition.        Patient's white count is very mildly elevated.  Lap score was in the normal range and bcr-ABL was negative.  Peripheral smear was normal.  We will continue to monitor.  We will have patient obtain a CBC with diff and CMP just prior to her next appointment.  We will plan to see her in 4 months in follow-up or sooner should the need arise.         Relevant Orders    CBC/DIFF    COMPREHENSIVE METABOLIC PANEL, NON-FASTING    Lupus (CMS HCC)       Cardiovascular System    Essential hypertension    History of angina    Raynaud disease    Hyperlipidemia       Respiratory    Asthma       Neurologic    Fibromyalgia    Migraines       Digestive    Fatty liver    Gastroparesis    IBS (irritable bowel syndrome)       Endocrine    Diabetes mellitus, type 2 (CMS HCC)    Pancreatitis     History of         Hypothyroidism       Psychiatric    Anxiety and depression          Return in about 4 months (around 09/29/2021) for In Person Visit.    Kailah Pennel Electronics engineer, FNP-C      CC:  Cam Hai, [address]  [place] [address]      Portions of this note may be dictated using voice recognition software or a dictation service.  Variances  in spelling and vocabulary are possible and unintentional. Not all errors are caught/corrected. Please notify the [facility] if any discrepancies are noted or if the meaning of any statement is not clear.

## 2021-05-29 NOTE — Assessment & Plan Note (Signed)
Reactive leukocytosis in a former smoker and recent removal of teeth due to dentition.        Patient's white count is very mildly elevated.  Lap score was in the normal range and bcr-ABL was negative.  Peripheral smear was normal.  We will continue to monitor.  We will have patient obtain a CBC with diff and CMP just prior to her next appointment.  We will plan to see her in 4 months in follow-up or sooner should the need arise.

## 2021-05-29 NOTE — Assessment & Plan Note (Signed)
History of

## 2021-09-29 ENCOUNTER — Ambulatory Visit (INDEPENDENT_AMBULATORY_CARE_PROVIDER_SITE_OTHER): Payer: Medicaid Other | Admitting: Family

## 2021-09-29 ENCOUNTER — Encounter (INDEPENDENT_AMBULATORY_CARE_PROVIDER_SITE_OTHER): Payer: Self-pay | Admitting: Family

## 2021-09-29 ENCOUNTER — Other Ambulatory Visit: Payer: Self-pay

## 2021-09-29 VITALS — BP 128/57 | HR 93 | Temp 98.2°F | Ht 71.5 in | Wt 213.1 lb

## 2021-09-29 DIAGNOSIS — Z6829 Body mass index (BMI) 29.0-29.9, adult: Secondary | ICD-10-CM

## 2021-09-29 DIAGNOSIS — R233 Spontaneous ecchymoses: Secondary | ICD-10-CM

## 2021-09-29 DIAGNOSIS — D72829 Elevated white blood cell count, unspecified: Secondary | ICD-10-CM

## 2021-09-30 ENCOUNTER — Encounter (INDEPENDENT_AMBULATORY_CARE_PROVIDER_SITE_OTHER): Payer: Self-pay | Admitting: Family

## 2021-09-30 DIAGNOSIS — R233 Spontaneous ecchymoses: Secondary | ICD-10-CM | POA: Insufficient documentation

## 2021-09-30 NOTE — Assessment & Plan Note (Signed)
Patient will be given orders to obtain a CBC with diff. We will plan to see her in follow up in 1 month or sooner if the need arises.

## 2021-09-30 NOTE — Cancer Center Note (Signed)
HEMATOLOGY/ONCOLOGY, [facility]  [place], [place] [address]      New Patient History and Physical     Name: Kathy Gibson MRN:  [id number]   Date: 09/29/2021 Age: 45 y.o. DOB: 12-23-1975       Referring Physician: Cam Hai, FNP  Primary Care Provider: Cam Hai, FNP    Reason for visit/consultation:   Leukocytosis        History of Present Illness:    The patient is being seen today in follow-up with a diagnosis of leukocytosis.  Patient has multiple medical issues that is not related to her leukocytosis.  She denies any fevers, sweats, but does have weight loss due to gastroparesis. She is being followed by her PCP for this. She states that she feels like she is bruising more easily and frequently.    ROS:   Negative except what is addressed in HPI.    Past Medical History  Past Medical History:   Diagnosis Date    Anxiety and depression     Asthma     Diabetes mellitus, type 2 (CMS HCC)     Essential hypertension     Fatty liver     Fibromyalgia     Gastroparesis     History of angina     Hyperlipidemia     Hypothyroidism     IBS (irritable bowel syndrome)     Insomnia     Liver disease     fatty liver    Lupus (CMS HCC)     Migraines     Pancreatitis     Raynaud disease       Past Surgical History:   Procedure Laterality Date    ABDOMINAL HERNIA REPAIR      CESAREAN SECTION      ESOPHAGEAL DILATION      HEEL SPUR SURGERY      HX BREAST LUMPECTOMY Left     benign    HX CHOLECYSTECTOMY      HX HYSTERECTOMY      HX LUMBAR FUSION      HX MENISCECTOMY      HX TUBAL LIGATION      REPLACEMENT TOTAL KNEE        Family Medical History:     Problem Relation (Age of Onset)    Breast Cancer Maternal Grandmother, Paternal Grandmother    Cervical Cancer Mother    Heart Attack Paternal Grandmother    Stroke Maternal Grandmother          Social History  Occupation:   Social History     Occupational History    Not on  file    Hometown: [place] [address]  Social History     Socioeconomic History    Marital status: Married   Tobacco Use    Smoking status: Former     Packs/day: 0.50     Years: 12.00     Pack years: 6.00     Types: Cigarettes     Quit date: 2017     Years since quitting: 5.9    Smokeless tobacco: Never   Vaping Use    Vaping Use: Former    Substances: THC   Substance and Sexual Activity    Alcohol use: Not Currently    Drug use: Yes     Types: Marijuana     Comment: legal pen to help with teeth pain       Allergies  Allergen Reactions    Cefdinir     Demerol [Meperidine]     Doxycycline     Morphine     Nsaids (Non-Steroidal Anti-Inflammatory Drug)      Current Outpatient Medications   Medication Sig    ACCU-CHEK GUIDE TEST STRIPS Does not apply Strip     ACCU-CHEK SOFTCLIX LANCETS Misc     amLODIPine (NORVASC) 5 mg Oral Tablet     aspirin (ECOTRIN) 81 mg Oral Tablet, Delayed Release (E.C.)     atorvastatin (LIPITOR) 40 mg Oral Tablet     BD INSULIN PEN NEEDLE UF 31 gauge x 5/16" Needle     bethanechol chloride (URECHOLINE) 5 mg Oral Tablet     clonazePAM (KLONOPIN) 0.5 mg Oral Tablet     cyanocobalamin (VITAMIN B12) 1,000 mcg/mL Injection Solution     dicyclomine (BENTYL) 10 mg Oral Capsule     DULoxetine (CYMBALTA DR) 30 mg Oral Capsule, Delayed Release(E.C.) 30 mg Once a day    ergocalciferol, vitamin D2, (DRISDOL) 1,250 mcg (50,000 unit) Oral Capsule     famotidine (PEPCID) 40 mg Oral Tablet     FREESTYLE LANCETS 28 gauge Misc     gabapentin (NEURONTIN) 300 mg Oral Capsule     hydrOXYchloroQUINE (PLAQUENIL) 200 mg Oral Tablet Twice daily    isosorbide mononitrate (IMDUR) 30 mg Oral Tablet Sustained Release 24 hr     JARDIANCE 25 mg Oral Tablet     LANTUS SOLOSTAR U-100 INSULIN 100 unit/mL (3 mL) Subcutaneous Insulin Pen     levothyroxine (SYNTHROID) 150 mcg Oral Tablet     Magnesium Gluconate 27 mg magnesium (500 mg) Oral Tablet Take by mouth    MetFORMIN (GLUCOPHAGE) 1,000 mg  Oral Tablet     metoprolol succinate (TOPROL-XL) 25 mg Oral Tablet Sustained Release 24 hr     NANO PEN NEEDLE 32 gauge x 5/32" Needle     nitroGLYCERIN (NITROSTAT) 0.4 mg Sublingual Tablet, Sublingual     NOVOLOG FLEXPEN U-100 INSULIN 100 unit/mL (3 mL) Subcutaneous Insulin Pen     omeprazole (PRILOSEC) 40 mg Oral Capsule, Delayed Release(E.C.)     TRULICITY 3 BD/5.3 mL Subcutaneous Pen Injector     ZENPEP 40,000-126,000- 168,000 unit Oral Capsule, Delayed Release(E.C.)        Physical Examination:  BP (!) 128/57 (Site: Left, Patient Position: Sitting, Cuff Size: Adult)    Pulse 93    Temp 36.8 C (98.2 F) (Thermal Scan)    Ht 1.816 m (5' 11.5")    Wt 96.7 kg (213 lb 1.6 oz)    BMI 29.31 kg/m       Physical Exam  Vitals and nursing note reviewed.   Constitutional:       Appearance: Normal appearance.   HENT:      Head: Normocephalic.      Mouth/Throat:      Mouth: Mucous membranes are moist.   Eyes:      General: No scleral icterus.     Extraocular Movements: Extraocular movements intact.   Cardiovascular:      Rate and Rhythm: Normal rate and regular rhythm.      Heart sounds: Normal heart sounds, S1 normal and S2 normal.   Pulmonary:      Effort: Pulmonary effort is normal.      Breath sounds: Normal breath sounds.   Abdominal:      General: Bowel sounds are normal.      Palpations: Abdomen is soft.   Musculoskeletal:  Cervical back: Normal range of motion and neck supple. No bony tenderness.      Thoracic back: No bony tenderness.      Lumbar back: No bony tenderness.   Lymphadenopathy:      Cervical: No cervical adenopathy.      Comments: No supraclavicular adenopathy   Skin:     General: Skin is warm and dry.   Neurological:      Mental Status: She is alert.      Motor: Motor function is intact.      Coordination: Coordination is intact.      Gait: Gait is intact.   Psychiatric:         Mood and Affect: Mood and affect normal.         Speech: Speech normal.         Behavior: Behavior is  cooperative.         Thought Content: Thought content normal.         Cognition and Memory: Cognition and memory normal.          Data reviewed:   Labs from 04/17/2021:  WBC 10.8, hemoglobin 12.9, platelets 406000, differential within normal limits.  Lap score 96, bcr not detected, TSH 0.75, BUN 11, creatinine 0.9, total bili 0.5, AST 16, ALT 25, alk-phos 128, CRP 0.9, ESR 6.  Peripheral smear report from 04/17/2021: rare elliptocytes seen.  Otherwise normal appearing peripheral smear.    Patient did not get the labs drawn ordered at the 05/28/21 visit.      Assessment/Plan:     1. Leukocytosis, reactive from poor dentition.    2. Diabetes mellitus    3. Asthma    4. Fatty liver    5. Fibromyalgia    6. Gastroparesis    7. Hyperlipidemia    8. Hypothyroidism    9. IBS    10. Lupus    11. Migraine headaches    12. Raynaud's syndrome    13. Easy bruisability       Problem List Items Addressed This Visit        Hematologic/Lymphatic    Leukocytosis - Primary     Patient will be given orders to obtain a CBC with diff. We will plan to see her in follow up in 1 month or sooner if the need arises.         Relevant Orders    CBC/DIFF       Dermatology    Easy bruising     Patient will be given orders to obtain a PT/INR. PTT and fibrinogen.          Relevant Orders    PT/INR    PTT (PARTIAL THROMBOPLASTIN TIME)    FIBRINOGEN    THROMBIN TIME (BOVINE), PLASMA       Return in about 30 days (around 10/29/2021) for In Person Visit.    Jordis Repetto Electronics engineer, FNP-C      CC:  Cam Hai, [place]  [place] [address]      Portions of this note may be dictated using voice recognition software or a dictation service. Variances in spelling and vocabulary are possible and unintentional. Not all errors are caught/corrected. Please notify the [facility] if any discrepancies are noted or if the meaning of any statement is not clear.

## 2021-09-30 NOTE — Assessment & Plan Note (Signed)
Patient will be given orders to obtain a PT/INR. PTT and fibrinogen.

## 2021-10-29 ENCOUNTER — Encounter (INDEPENDENT_AMBULATORY_CARE_PROVIDER_SITE_OTHER): Payer: Self-pay | Admitting: NURSE PRACTITIONER

## 2021-11-11 ENCOUNTER — Ambulatory Visit (INDEPENDENT_AMBULATORY_CARE_PROVIDER_SITE_OTHER): Payer: Medicaid Other | Admitting: NURSE PRACTITIONER

## 2021-11-11 ENCOUNTER — Encounter (INDEPENDENT_AMBULATORY_CARE_PROVIDER_SITE_OTHER): Payer: Self-pay | Admitting: NURSE PRACTITIONER

## 2021-11-11 ENCOUNTER — Other Ambulatory Visit: Payer: Self-pay

## 2021-11-11 VITALS — BP 114/71 | HR 84 | Temp 97.7°F | Ht 71.5 in | Wt 206.0 lb

## 2021-11-11 DIAGNOSIS — D72829 Elevated white blood cell count, unspecified: Secondary | ICD-10-CM

## 2021-11-11 DIAGNOSIS — Z6828 Body mass index (BMI) 28.0-28.9, adult: Secondary | ICD-10-CM

## 2021-11-11 DIAGNOSIS — R233 Spontaneous ecchymoses: Secondary | ICD-10-CM

## 2021-11-11 NOTE — Cancer Center Note (Signed)
HEMATOLOGY/ONCOLOGY, [place]  [place], [place] [address]      New Patient History and Physical     Name: Kathy Gibson MRN:  [id number]   Date: 11/11/2021 Age: 46 y.o. DOB: 16-Mar-1976       Referring Physician: Cam Hai, FNP  Primary Care Provider: Cam Hai, FNP    Reason for visit/consultation:   Leukocytosis        History of Present Illness:    The patient is being seen today in follow-up with a diagnosis of leukocytosis.  Patient has multiple medical issues that is not related to her leukocytosis.  She denies any fevers, sweats, but does have weight loss due to gastroparesis. She is being followed by her PCP for this. She states that she feels like she is bruising more easily and frequently. She states that she had hit her foot at home twice on 10/27/2021 and had a large bruise develop on her foot. She had X-rays and was told she did not fracture her foot.    ROS:   Negative except what is addressed in HPI.    Past Medical History  Past Medical History:   Diagnosis Date   . Anxiety and depression    . Asthma    . Diabetes mellitus, type 2 (CMS HCC)    . Essential hypertension    . Fatty liver    . Fibromyalgia    . Gastroparesis    . History of angina    . Hyperlipidemia    . Hypothyroidism    . IBS (irritable bowel syndrome)    . Insomnia    . Liver disease     fatty liver   . Lupus (CMS HCC)    . Migraines    . Pancreatitis    . Raynaud disease       Past Surgical History:   Procedure Laterality Date   . ABDOMINAL HERNIA REPAIR     . CESAREAN SECTION     . ESOPHAGEAL DILATION     . HEEL SPUR SURGERY     . HX BREAST LUMPECTOMY Left     benign   . HX CHOLECYSTECTOMY     . HX HYSTERECTOMY     . HX LUMBAR FUSION     . HX MENISCECTOMY     . HX TUBAL LIGATION     . REPLACEMENT TOTAL KNEE        Family Medical History:     Problem Relation (Age of Onset)    Breast Cancer Maternal Grandmother, Paternal Grandmother    Cervical Cancer  Mother    Heart Attack Paternal Grandmother    Stroke Maternal Grandmother          Social History  Occupation:   Social History     Occupational History   . Not on file    [place] [address]  Social History     Socioeconomic History   . Marital status: Married   Tobacco Use   . Smoking status: Former     Packs/day: 0.50     Years: 12.00     Pack years: 6.00     Types: Cigarettes     Quit date: 2017     Years since quitting: 6.0   . Smokeless tobacco: Never   Vaping Use   . Vaping Use: Former   . Substances: THC   Substance and Sexual Activity   . Alcohol  use: Not Currently   . Drug use: Yes     Types: Marijuana     Comment: legal pen to help with teeth pain       Allergies   Allergen Reactions   . Cefdinir    . Demerol [Meperidine]    . Doxycycline    . Morphine    . Nsaids (Non-Steroidal Anti-Inflammatory Drug)      Current Outpatient Medications   Medication Sig   . ACCU-CHEK GUIDE TEST STRIPS Does not apply Strip    . ACCU-CHEK SOFTCLIX LANCETS Misc    . amLODIPine (NORVASC) 5 mg Oral Tablet    . aspirin (ECOTRIN) 81 mg Oral Tablet, Delayed Release (E.C.)    . atorvastatin (LIPITOR) 40 mg Oral Tablet    . BD INSULIN PEN NEEDLE UF 31 gauge x 5/16" Needle    . bethanechol chloride (URECHOLINE) 5 mg Oral Tablet    . clonazePAM (KLONOPIN) 0.5 mg Oral Tablet    . cyanocobalamin (VITAMIN B12) 1,000 mcg/mL Injection Solution    . dicyclomine (BENTYL) 10 mg Oral Capsule    . DULoxetine (CYMBALTA DR) 30 mg Oral Capsule, Delayed Release(E.C.) 1 Capsule (30 mg total) Once a day   . ergocalciferol, vitamin D2, (DRISDOL) 1,250 mcg (50,000 unit) Oral Capsule    . famotidine (PEPCID) 40 mg Oral Tablet    . FREESTYLE LANCETS 28 gauge Misc    . gabapentin (NEURONTIN) 300 mg Oral Capsule    . hydrOXYchloroQUINE (PLAQUENIL) 200 mg Oral Tablet Twice daily   . isosorbide mononitrate (IMDUR) 30 mg Oral Tablet Sustained Release 24 hr    . JARDIANCE 25 mg Oral Tablet    . LANTUS SOLOSTAR U-100 INSULIN 100 unit/mL (3 mL)  Subcutaneous Insulin Pen    . levothyroxine (SYNTHROID) 150 mcg Oral Tablet    . Magnesium Gluconate 27 mg magnesium (500 mg) Oral Tablet Take by mouth   . MetFORMIN (GLUCOPHAGE) 1,000 mg Oral Tablet    . metoprolol succinate (TOPROL-XL) 25 mg Oral Tablet Sustained Release 24 hr    . NANO PEN NEEDLE 32 gauge x 5/32" Needle    . nitroGLYCERIN (NITROSTAT) 0.4 mg Sublingual Tablet, Sublingual    . NOVOLOG FLEXPEN U-100 INSULIN 100 unit/mL (3 mL) Subcutaneous Insulin Pen    . omeprazole (PRILOSEC) 40 mg Oral Capsule, Delayed Release(E.C.)    . TRULICITY 3 RP/5.9 mL Subcutaneous Pen Injector    . ZENPEP 40,000-126,000- 168,000 unit Oral Capsule, Delayed Release(E.C.)        Physical Examination:  BP 114/71 (Site: Left, Patient Position: Sitting, Cuff Size: Adult)   Pulse 84   Temp 36.5 C (97.7 F) (Temporal)   Ht 1.816 m (5' 11.5")   Wt 93.4 kg (206 lb)   BMI 28.33 kg/m       Physical Exam  Vitals and nursing note reviewed.   Constitutional:       Appearance: Normal appearance.   HENT:      Head: Normocephalic.      Mouth/Throat:      Mouth: Mucous membranes are moist.   Eyes:      General: No scleral icterus.     Extraocular Movements: Extraocular movements intact.   Cardiovascular:      Rate and Rhythm: Normal rate and regular rhythm.      Heart sounds: Normal heart sounds, S1 normal and S2 normal.   Pulmonary:      Effort: Pulmonary effort is normal.      Breath sounds: Normal breath  sounds.   Abdominal:      General: Bowel sounds are normal.      Palpations: Abdomen is soft.   Musculoskeletal:      Cervical back: Normal range of motion and neck supple. No bony tenderness.      Thoracic back: No bony tenderness.      Lumbar back: No bony tenderness.   Lymphadenopathy:      Cervical: No cervical adenopathy.      Comments: No supraclavicular adenopathy   Skin:     General: Skin is warm and dry.   Neurological:      Mental Status: She is alert.      Motor: Motor function is intact.      Coordination: Coordination  is intact.      Gait: Gait is intact.   Psychiatric:         Mood and Affect: Mood and affect normal.         Speech: Speech normal.         Behavior: Behavior is cooperative.         Thought Content: Thought content normal.         Cognition and Memory: Cognition and memory normal.          Data reviewed:   Labs from 09/29/2021: WBC 7.6, HGB 12.5, HCT 39.5, PLT 259 K, PT 12.0, INR 1.1, Fibrinogen 378.    Labs from 04/17/2021:  WBC 10.8, hemoglobin 12.9, platelets 406000, differential within normal limits.  Lap score 96, bcr not detected, TSH 0.75, BUN 11, creatinine 0.9, total bili 0.5, AST 16, ALT 25, alk-phos 128, CRP 0.9, ESR 6.  Peripheral smear report from 04/17/2021: rare elliptocytes seen.  Otherwise normal appearing peripheral smear.      Assessment/Plan:     1. Leukocytosis, reactive from poor dentition.    2. Diabetes mellitus    3. Asthma    4. Fatty liver    5. Fibromyalgia    6. Gastroparesis    7. Hyperlipidemia    8. Hypothyroidism    9. IBS    10. Lupus    11. Migraine headaches    12. Raynaud's syndrome    13. Easy bruisability       Problem List Items Addressed This Visit        Hematologic/Lymphatic    Leukocytosis - Primary       Dermatology    Easy bruising       Return in about 2 months (around 01/09/2022).    Kathrin Penner, APRN, FNP-C      CC:  Cam Hai, FNP  [address]  [place] [address]      Portions of this note may be dictated using voice recognition software or a dictation service. Variances in spelling and vocabulary are possible and unintentional. Not all errors are caught/corrected. Please notify the [facility] if any discrepancies are noted or if the meaning of any statement is not clear.

## 2021-12-27 ENCOUNTER — Other Ambulatory Visit (RURAL_HEALTH_CENTER): Payer: Self-pay | Admitting: Family Medicine

## 2022-01-11 ENCOUNTER — Encounter (INDEPENDENT_AMBULATORY_CARE_PROVIDER_SITE_OTHER): Payer: Self-pay | Admitting: Family

## 2022-01-29 ENCOUNTER — Other Ambulatory Visit: Payer: Self-pay

## 2022-01-29 ENCOUNTER — Ambulatory Visit: Payer: Medicaid Other

## 2022-01-29 DIAGNOSIS — E1129 Type 2 diabetes mellitus with other diabetic kidney complication: Secondary | ICD-10-CM | POA: Insufficient documentation

## 2022-01-29 DIAGNOSIS — E1165 Type 2 diabetes mellitus with hyperglycemia: Secondary | ICD-10-CM | POA: Insufficient documentation

## 2022-01-29 DIAGNOSIS — E890 Postprocedural hypothyroidism: Secondary | ICD-10-CM | POA: Insufficient documentation

## 2022-01-29 DIAGNOSIS — M4802 Spinal stenosis, cervical region: Secondary | ICD-10-CM | POA: Insufficient documentation

## 2022-01-29 DIAGNOSIS — G8320 Monoplegia of upper limb affecting unspecified side: Secondary | ICD-10-CM | POA: Insufficient documentation

## 2022-01-29 LAB — SEDIMENTATION RATE: ERYTHROCYTE SEDIMENTATION RATE (ESR): 6 mm/hr (ref <=20)

## 2022-01-29 LAB — C-REACTIVE PROTEIN (CRP): C-REACTIVE PROTEIN (CRP): 0.3 mg/dL (ref 0.1–0.5)

## 2022-01-29 LAB — THYROID STIMULATING HORMONE (SENSITIVE TSH): TSH: 2.787 u[IU]/mL (ref 0.450–5.330)

## 2022-01-29 LAB — THYROXINE, FREE (FREE T4): THYROXINE (T4), FREE: 0.91 ng/dL (ref 0.58–1.64)

## 2022-01-30 LAB — HGA1C (HEMOGLOBIN A1C WITH EST AVG GLUCOSE): HEMOGLOBIN A1C: 6.4 % — ABNORMAL HIGH (ref 4.0–6.0)

## 2022-02-01 LAB — HEP-2 SUBSTRATE ANTINUCLEAR ANTIBODIES (ANA), SERUM: ANA INTERPRETATION: NEGATIVE

## 2022-02-01 LAB — THYROPEROXIDASE (TPO) ANTIBODIES, SERUM: ANTI THYROPEROXIDASE ANTIBODIES: 9 IU/mL (ref <=14)

## 2022-02-03 LAB — THYROID STIMULATING IMMUNOGLOBULIN (TSI), SERUM: TSI: <89 % baseline (ref <140)

## 2022-06-03 ENCOUNTER — Other Ambulatory Visit (RURAL_HEALTH_CENTER): Payer: Self-pay | Admitting: Family Medicine

## 2022-06-03 NOTE — Telephone Encounter (Signed)
She is not a patient here please ask her who her PCP is it is not this office

## 2022-06-30 ENCOUNTER — Other Ambulatory Visit (RURAL_HEALTH_CENTER): Payer: Self-pay | Admitting: Family Medicine

## 2022-07-08 ENCOUNTER — Other Ambulatory Visit (RURAL_HEALTH_CENTER): Payer: Self-pay | Admitting: Family Medicine

## 2022-07-13 ENCOUNTER — Encounter (HOSPITAL_BASED_OUTPATIENT_CLINIC_OR_DEPARTMENT_OTHER): Payer: Self-pay

## 2022-07-13 ENCOUNTER — Emergency Department
Admission: EM | Admit: 2022-07-13 | Discharge: 2022-07-14 | Disposition: A | Discharge status: Home / Self Care | Payer: Medicaid Other | Attending: Emergency Medicine | Admitting: Emergency Medicine

## 2022-07-13 ENCOUNTER — Other Ambulatory Visit: Payer: Self-pay

## 2022-07-13 DIAGNOSIS — E11649 Type 2 diabetes mellitus with hypoglycemia without coma: Secondary | ICD-10-CM | POA: Insufficient documentation

## 2022-07-13 DIAGNOSIS — E162 Hypoglycemia, unspecified: Secondary | ICD-10-CM

## 2022-07-13 DIAGNOSIS — Z20822 Contact with and (suspected) exposure to covid-19: Secondary | ICD-10-CM | POA: Insufficient documentation

## 2022-07-13 DIAGNOSIS — Z28310 Unvaccinated for covid-19: Secondary | ICD-10-CM | POA: Insufficient documentation

## 2022-07-13 DIAGNOSIS — M797 Fibromyalgia: Secondary | ICD-10-CM | POA: Insufficient documentation

## 2022-07-13 LAB — POC BLOOD GLUCOSE (RESULTS): GLUCOSE, POC: 103 mg/dl (ref 50–500)

## 2022-07-13 NOTE — ED Triage Notes (Addendum)
Patient has been having low blood sugar since approx Thursday.  States she has had readings in the 40's.  States she had a "low" reading on Friday morning.  Has not used any of her fast acting insulin approx 1 month and has decreased her Lantus from 25 units to 20 units.  States she feels weak, tired, and like her arms are tingling.  Last used Lantus 20 units at 0800 this morning.   Accucheck 109 in triage.

## 2022-07-13 NOTE — ED Nurses Note (Addendum)
Wile in room with patient, patient glucometer alarmed that blood sugar was 74.  Accucheck obtained using hospital glucometer was 103.

## 2022-07-14 ENCOUNTER — Other Ambulatory Visit (RURAL_HEALTH_CENTER): Payer: Self-pay | Admitting: Family Medicine

## 2022-07-14 LAB — CBC WITH DIFF
BASOPHIL #: 0.03 10*3/uL (ref 0.00–0.30)
BASOPHIL %: 0 % (ref 0–3)
EOSINOPHIL #: 0.29 10*3/uL (ref 0.00–0.80)
EOSINOPHIL %: 3 % (ref 0–7)
HCT: 39.2 % (ref 37.0–47.0)
HGB: 12.4 g/dL — ABNORMAL LOW (ref 12.5–16.0)
LYMPHOCYTE #: 2.88 10*3/uL (ref 1.10–5.00)
LYMPHOCYTE %: 30 % (ref 25–45)
MCH: 25.2 pg — ABNORMAL LOW (ref 27.0–32.0)
MCHC: 31.6 g/dL — ABNORMAL LOW (ref 32.0–36.0)
MCV: 79.9 fL (ref 78.0–99.0)
MONOCYTE #: 0.56 10*3/uL (ref 0.00–1.30)
MONOCYTE %: 6 % (ref 0–12)
MPV: 9 fL (ref 7.4–10.4)
NEUTROPHIL #: 5.8 10*3/uL (ref 1.80–8.40)
NEUTROPHIL %: 61 % (ref 40–76)
PLATELETS: 253 10*3/uL (ref 140–440)
RBC: 4.91 10*6/uL (ref 4.20–5.40)
RDW: 19.1 % — ABNORMAL HIGH (ref 11.6–14.8)
WBC: 9.6 10*3/uL (ref 4.0–10.5)

## 2022-07-14 LAB — COVID-19, FLU A/B, RSV RAPID BY PCR
INFLUENZA VIRUS TYPE A: NOT DETECTED
INFLUENZA VIRUS TYPE B: NOT DETECTED
RESPIRATORY SYNCTIAL VIRUS (RSV): NOT DETECTED
SARS-CoV-2: NOT DETECTED

## 2022-07-14 LAB — URINALYSIS, MACRO/MICRO
BILIRUBIN: NEGATIVE mg/dL
BLOOD: NEGATIVE mg/dL
GLUCOSE: 500 mg/dL — AB
LEUKOCYTES: NEGATIVE WBCs/uL
NITRITE: NEGATIVE
PH: 5.5 (ref 4.6–8.0)
PROTEIN: NEGATIVE mg/dL
SPECIFIC GRAVITY: >=1.03 (ref 1.003–1.035)
UROBILINOGEN: 0.2 mg/dL (ref 0.2–1.0)

## 2022-07-14 LAB — COMPREHENSIVE METABOLIC PANEL, NON-FASTING
ALBUMIN/GLOBULIN RATIO: 1.2 (ref 0.8–1.4)
ALBUMIN: 4 g/dL (ref 3.4–5.0)
ALKALINE PHOSPHATASE: 114 U/L (ref 46–116)
ALT (SGPT): 22 U/L (ref <=78)
ANION GAP: 9 mmol/L (ref 4–13)
AST (SGOT): 19 U/L (ref 15–37)
BILIRUBIN TOTAL: 0.3 mg/dL (ref 0.2–1.0)
BUN/CREA RATIO: 16
BUN: 14 mg/dL (ref 7–18)
CALCIUM, CORRECTED: 9.4 mg/dL
CALCIUM: 9.4 mg/dL (ref 8.5–10.1)
CHLORIDE: 103 mmol/L (ref 98–107)
CO2 TOTAL: 29 mmol/L (ref 21–32)
CREATININE: 0.89 mg/dL (ref 0.55–1.02)
ESTIMATED GFR: 81 mL/min/{1.73_m2} (ref >59)
GLOBULIN: 3.4
GLUCOSE: 97 mg/dL (ref 74–106)
OSMOLALITY, CALCULATED: 282 mOsm/kg (ref 270–290)
POTASSIUM: 3.8 mmol/L (ref 3.5–5.1)
PROTEIN TOTAL: 7.4 g/dL (ref 6.4–8.2)
SODIUM: 141 mmol/L (ref 136–145)

## 2022-07-14 LAB — POC BLOOD GLUCOSE (RESULTS): GLUCOSE, POC: 94 mg/dl (ref 50–500)

## 2022-07-14 MED ORDER — ACETAMINOPHEN 325 MG TABLET
975.0000 mg | ORAL_TABLET | ORAL | Status: DC
Start: 2022-07-14 — End: 2022-07-14
  Administered 2022-07-14: 0 mg via ORAL

## 2022-07-14 MED ORDER — PROCHLORPERAZINE EDISYLATE 10 MG/2 ML (5 MG/ML) INJECTION SOLUTION
10.0000 mg | INTRAMUSCULAR | Status: DC
Start: 2022-07-14 — End: 2022-07-14
  Administered 2022-07-14: 0 mg via INTRAVENOUS

## 2022-07-14 MED ORDER — ACETAMINOPHEN 325 MG TABLET
ORAL_TABLET | ORAL | Status: AC
Start: 2022-07-14 — End: 2022-07-14
  Filled 2022-07-14: qty 3

## 2022-07-14 NOTE — ED Nurses Note (Signed)
Patient given discharge instructions. Encouraged to follow up with PCP or endocrinologist for hypoglycemic episodes. Verbalized understanding and denied further needs.

## 2022-07-14 NOTE — ED Nurses Note (Signed)
Patient reports hypoglycemic episodes starting Thursday evening. Patient states she has had her dexcom has been ringing out with her blood sugar in the 40's or below. Patient states with the hypoglycemic episodes she has had numbness and tingling in the bilateral upper extremities as well as a frontal headache. Patient states headache is across the forehead, ranking the pain 6/10, and it feels like a rubber band squeezing her head. Patient took 3 ibuprofen approximately 21:00 with no relief. Patient states she has been eating and drinking normally. She has reduced her Lantus from 25 to 20 and has not been taking her short acting insulin. Patient states she is still having hypoglycemic episodes.

## 2022-07-14 NOTE — ED Provider Notes (Addendum)
[facility], [facility] Emergency Department  ED Primary Provider Note  HPI:  Kathy Gibson is a 46 y.o. female     Over the past 2 weeks patient has noted low blood sugar on her glucometer.  It is red simply low over the last couple days.  She endorses tingling in the extremities .  Over last few days she is had headache mild gradual onset.  Patient is a type 2 diabetic.  She is maintained on insulin and Trulicity.  She is prescribed 25 units of long-acting but has back down to proximally 20 units on her own due to 3 reported low blood sugars.  She is not passed out but stayed awake overnight a couple nights ago she was scared to go to sleep.  Patient also has a history of mixed connective tissue disorder lupus fibromyalgia.  She is not COVID vaccinated.  Full code.    ROS review and negative aside from stated in HPI.    Physical Exam:  ED Triage Vitals [07/13/22 2327]   BP (Non-Invasive) 126/86   Heart Rate 66   Respiratory Rate 18   Temperature 36.9 C (98.4 F)   SpO2 99 %   Weight 98 kg (216 lb)   Height 1.803 m ('5\' 11"'$ )     No acute distress.  Patient awake alert oriented x3.  Mood is appropriate.  Pupils 3 mm equal round reactive.  Extraocular movements are intact.  Oropharynx is clear.  Mucous membranes moist.  Trachea midline.  Neck is supple.  Heart has regular rate and rhythm without significant murmurs rubs or gallops.  Lungs are clear to auscultation.  Abdomen soft nontender, nondistended.  Moving all extremities without difficulty.  No rash no edema.      Patient data:  Labs Ordered/Reviewed   CBC WITH DIFF - Abnormal; Notable for the following components:       Result Value    HGB 12.4 (*)     MCH 25.2 (*)     MCHC 31.6 (*)     RDW 19.1 (*)     All other components within normal limits   URINALYSIS, MACRO/MICRO - Abnormal; Notable for the following components:    GLUCOSE 500 (*)     KETONES Trace (*)     All other components within normal limits   COVID-19, FLU A/B, RSV  RAPID BY PCR - Normal    Narrative:     Results are for the simultaneous qualitative identification of SARS-CoV-2 (formerly 2019-nCoV), Influenza A, Influenza B, and RSV RNA. These etiologic agents are generally detectable in nasopharyngeal and nasal swabs during the ACUTE PHASE of infection. Hence, this test is intended to be performed on respiratory specimens collected from individuals with signs and symptoms of upper respiratory tract infection who meet Centers for Disease Control and Prevention (CDC) clinical and/or epidemiological criteria for Coronavirus Disease 2019 (COVID-19) testing. CDC COVID-19 criteria for testing on human specimens is available at [facility] webpage information for Healthcare Professionals: Coronavirus Disease 2019 (COVID-19) ([email or web address]).     False-negative results may occur if the virus has genomic mutations, insertions, deletions, or rearrangements or if performed very early in the course of illness. Otherwise, negative results indicate virus specific RNA targets are not detected, however negative results do not preclude SARS-CoV-2 infection/COVID-19, Influenza, or Respiratory syncytial virus infection. Results should not be used as the sole basis for patient management decisions. Negative results must be combined with clinical observations, patient history, and epidemiological information. If upper  respiratory tract infection is still suspected based on exposure history together with other clinical findings, re-testing should be considered.    Disclaimer:   This assay has been authorized by FDA under an Emergency Use Authorization for use in laboratories certified under the Clinical Laboratory Improvement Amendments of 1988 (CLIA), 42 U.S.C. 817 072 4951, to perform high complexity tests. The impacts of vaccines, antiviral therapeutics, antibiotics, chemotherapeutic or immunosuppressant drugs have not been evaluated.     Test methodology:   Cepheid  Xpert Xpress SARS-CoV-2/Flu/RSV Assay real-time polymerase chain reaction (RT-PCR) test on the GeneXpert Dx and Xpert Xpress systems.   POC BLOOD GLUCOSE (RESULTS) - Normal   POC BLOOD GLUCOSE (RESULTS) - Normal   CBC/DIFF    Narrative:     The following orders were created for panel order CBC/DIFF.  Procedure                               Abnormality         Status                     ---------                               -----------         ------                     CBC WITH ESPQ[[id number]]                Abnormal            Final result                 Please view results for these tests on the individual orders.   COMPREHENSIVE METABOLIC PANEL, NON-FASTING    Narrative:     Estimated Glomerular Filtration Rate (eGFR) is calculated using the CKD-EPI (2021) equation, intended for patients 19 years of age and older. If gender is not documented or "unknown", there will be no eGFR calculation.   URINALYSIS WITH REFLEX MICROSCOPIC AND CULTURE IF POSITIVE    Narrative:     The following orders were created for panel order URINALYSIS WITH REFLEX MICROSCOPIC AND CULTURE IF POSITIVE.  Procedure                               Abnormality         Status                     ---------                               -----------         ------                     URINALYSIS, JFHLK/[id number]]      Abnormal            Final result                 Please view results for these tests on the individual orders.     No orders to display       MDM:  Hypoglycemia.  I do suspect the patient is overmedicated given 100 lb weight  loss.  Here in the department noted discrepancy.  Her glucometer reads less than 40 fingerstick here in the 100s and almost low as 90.  Her glucometer needs recalibration replacement.   We discussed next steps.  I recommended that she cut down or completely stop her insulin regimen as her reported A1c of 5 is well within goal.  She is been evaluated by her diabetic physician.  I gave strict return to ED  instructions both verbal written manner.  Patient comfortable with discharge  ED Course as of 07/14/22 2101   Wed Jul 14, 2022   2100 Follow up call tonight.  Patient states he is feel well she was able to calibrate her glucometer.  The lowest glucose she recorded was 75.  We discussed cutting down her insulin and she will follow with primary care.      Discharged  Clinical Impression   Hypoglycemia (Primary)             Current Discharge Medication List        CONTINUE these medications - NO CHANGES were made during your visit.        Details   * FreeStyle Lancets 28 gauge Misc  Generic drug: lancets   No dose, route, or frequency recorded.  Refills: 0     * Accu-Chek Softclix Lancets Misc  Generic drug: Lancets   No dose, route, or frequency recorded.  Refills: 0     atorvastatin 40 mg Tablet  Commonly known as: LIPITOR   No dose, route, or frequency recorded.  Refills: 0     * BD UF Short Pen Needle 31 gauge x 5/16" Needle  Generic drug: Pen Needle (Disposable)   No dose, route, or frequency recorded.  Refills: 0     * BD UF Nano Pen Needle 32 gauge x 5/32" Needle  Generic drug: Pen Needle (Disposable)   No dose, route, or frequency recorded.  Refills: 0     bethanechol chloride 5 mg Tablet  Commonly known as: URECHOLINE   No dose, route, or frequency recorded.  Refills: 0     clonazePAM 0.5 mg Tablet  Commonly known as: klonoPIN   No dose, route, or frequency recorded.  Refills: 0     cyanocobalamin 1,000 mcg/mL Solution  Commonly known as: VITAMIN B12   No dose, route, or frequency recorded.  Refills: 0     desvenlafaxine succinate 25 mg Tablet Sustained Release 24 hr   50 mg, Oral, DAILY  Refills: 0     dicyclomine 10 mg Capsule  Commonly known as: BENTYL   No dose, route, or frequency recorded.  Refills: 0     ergocalciferol (vitamin D2) 1,250 mcg (50,000 unit) Capsule  Commonly known as: DRISDOL   No dose, route, or frequency recorded.  Refills: 0     famotidine 40 mg Tablet  Commonly known as: PEPCID   No  dose, route, or frequency recorded.  Refills: 0     gabapentin 300 mg Capsule  Commonly known as: NEURONTIN   No dose, route, or frequency recorded.  Refills: 0     hydroxychloroquine 200 mg Tablet  Commonly known as: PLAQUENIL   2 TIMES DAILY  Refills: 0     isosorbide mononitrate 30 mg Tablet Sustained Release 24 hr  Commonly known as: IMDUR   No dose, route, or frequency recorded.  Refills: 0     Jardiance 25 mg Tablet  Generic drug: empagliflozin   No dose, route, or frequency recorded.  Refills: 0     Lantus Solostar U-100 Insulin 100 unit/mL Insulin Pen  Generic drug: insulin glargine   No dose, route, or frequency recorded.  Refills: 0     levothyroxine 150 mcg Tablet  Commonly known as: SYNTHROID   No dose, route, or frequency recorded.  Refills: 0     Magnesium Gluconate 27 mg magnesium (500 mg) Tablet   Oral  Refills: 0     magnesium oxide 400 mg Tablet  Commonly known as: MAG-OX   TAKE ONE TABLET BY MOUTH DAILY  Qty: 30 Tablet  Refills: 5     mirtazapine 15 mg Tablet  Commonly known as: REMERON   15 mg, Oral, NIGHTLY  Refills: 0     nitroGLYCERIN 0.4 mg Tablet, Sublingual  Commonly known as: NITROSTAT   No dose, route, or frequency recorded.  Refills: 0     NovoLOG FlexPen U-100 Insulin 100 unit/mL (3 mL) Insulin Pen  Generic drug: insulin aspart U-100   No dose, route, or frequency recorded.  Refills: 0     omeprazole 40 mg Capsule, Delayed Release(E.C.)  Commonly known as: PRILOSEC   No dose, route, or frequency recorded.  Refills: 0     ondansetron 8 mg Tablet  Commonly known as: ZOFRAN   8 mg, Oral, EVERY 8 HOURS PRN  Refills: 0     Trulicity 3 SH/3.8 mL Pen Injector  Generic drug: dulaglutide   No dose, route, or frequency recorded.  Refills: 0     Zenpep 40,000-126,000- 168,000 unit Capsule, Delayed Release(E.C.)  Generic drug: lipase-protease-amylase   No dose, route, or frequency recorded.  Refills: 0           * This list has 4 medication(s) that are the same as other medications prescribed for  you. Read the directions carefully, and ask your doctor or other care provider to review them with you.

## 2022-07-14 NOTE — Telephone Encounter (Signed)
Patient is no longer patient in this clinic you will need to contact her new PCP/br

## 2022-07-14 NOTE — Discharge Instructions (Signed)
Headache tingling in her hands and low blood sugars.  Your evaluation did not reveal critical condition requiring hospitalization.  I do suggest that you stop your insulin at this time and check your blood sugars frequently by sticking herself.  You likely need to keep your automatic glucometer calibrated.  See your diabetic doctor and primary care doctor ideally within the next day or 2.  Return to emergency department sooner if you have severe headache vomiting difficulty maintaining her sugars or any other concerning symptoms.  Thank you for visiting [facility].

## 2022-07-19 ENCOUNTER — Other Ambulatory Visit (RURAL_HEALTH_CENTER): Payer: Self-pay | Admitting: Family Medicine

## 2022-07-20 ENCOUNTER — Other Ambulatory Visit (RURAL_HEALTH_CENTER): Payer: Self-pay | Admitting: Family Medicine

## 2022-07-21 NOTE — Telephone Encounter (Signed)
She is not a patient here.

## 2022-10-25 ENCOUNTER — Other Ambulatory Visit: Payer: Self-pay

## 2022-10-25 ENCOUNTER — Ambulatory Visit: Payer: Medicaid Other | Attending: Internal Medicine

## 2022-10-25 DIAGNOSIS — E785 Hyperlipidemia, unspecified: Secondary | ICD-10-CM | POA: Insufficient documentation

## 2022-10-25 DIAGNOSIS — E890 Postprocedural hypothyroidism: Secondary | ICD-10-CM | POA: Insufficient documentation

## 2022-10-25 DIAGNOSIS — E119 Type 2 diabetes mellitus without complications: Secondary | ICD-10-CM | POA: Insufficient documentation

## 2022-10-25 LAB — COMPREHENSIVE METABOLIC PANEL, NON-FASTING
ALBUMIN/GLOBULIN RATIO: 1.6 — ABNORMAL HIGH (ref 0.8–1.4)
ALBUMIN: 4.5 g/dL (ref 3.5–5.7)
ALKALINE PHOSPHATASE: 111 U/L — ABNORMAL HIGH (ref 34–104)
ALT (SGPT): 23 U/L (ref 7–52)
ANION GAP: 7 mmol/L (ref 4–13)
AST (SGOT): 19 U/L (ref 13–39)
BILIRUBIN TOTAL: 0.5 mg/dL (ref 0.3–1.2)
BUN/CREA RATIO: 17 (ref 6–22)
BUN: 15 mg/dL (ref 7–25)
CALCIUM, CORRECTED: 9.2 mg/dL (ref 8.9–10.8)
CALCIUM: 9.6 mg/dL (ref 8.6–10.3)
CHLORIDE: 103 mmol/L (ref 98–107)
CO2 TOTAL: 28 mmol/L (ref 21–31)
CREATININE: 0.89 mg/dL (ref 0.60–1.30)
ESTIMATED GFR: 81 mL/min/{1.73_m2} (ref >59)
GLOBULIN: 2.9 (ref 2.9–5.4)
GLUCOSE: 167 mg/dL — ABNORMAL HIGH (ref 74–109)
OSMOLALITY, CALCULATED: 280 mOsm/kg (ref 270–290)
POTASSIUM: 3.6 mmol/L (ref 3.5–5.1)
PROTEIN TOTAL: 7.4 g/dL (ref 6.4–8.9)
SODIUM: 138 mmol/L (ref 136–145)

## 2022-10-25 LAB — VITAMIN D 25 TOTAL: VITAMIN D: 40 ng/mL (ref 30–100)

## 2022-10-25 LAB — LIPID PANEL
CHOL/HDL RATIO: 2.6
CHOLESTEROL: 166 mg/dL (ref <200)
HDL CHOL: 64 mg/dL (ref 23–92)
LDL CALC: 80 mg/dL (ref 0–100)
TRIGLYCERIDES: 112 mg/dL (ref <=150)
VLDL CALC: 22 mg/dL (ref 0–50)

## 2022-10-25 LAB — HGA1C (HEMOGLOBIN A1C WITH EST AVG GLUCOSE): HEMOGLOBIN A1C: 6.3 % — ABNORMAL HIGH (ref 4.0–6.0)

## 2022-10-25 LAB — THYROID STIMULATING HORMONE (SENSITIVE TSH): TSH: 9.832 u[IU]/mL — ABNORMAL HIGH (ref 0.450–5.330)

## 2022-10-25 LAB — THYROXINE, FREE (FREE T4): THYROXINE (T4), FREE: 0.61 ng/dL (ref 0.58–1.64)

## 2023-02-28 ENCOUNTER — Ambulatory Visit (HOSPITAL_BASED_OUTPATIENT_CLINIC_OR_DEPARTMENT_OTHER): Payer: Self-pay | Admitting: Internal Medicine

## 2023-02-28 ENCOUNTER — Other Ambulatory Visit (HOSPITAL_BASED_OUTPATIENT_CLINIC_OR_DEPARTMENT_OTHER): Payer: Self-pay | Admitting: Internal Medicine

## 2023-02-28 MED ORDER — ATORVASTATIN 40 MG TABLET: 40.0000 mg | ORAL_TABLET | Freq: Every evening | ORAL | 3 refills | Status: DC

## 2023-02-28 NOTE — Telephone Encounter (Signed)
Patient requested refill on Atorvastatin 40 mg sent to [facility]

## 2023-04-18 ENCOUNTER — Other Ambulatory Visit (HOSPITAL_BASED_OUTPATIENT_CLINIC_OR_DEPARTMENT_OTHER): Payer: Self-pay | Admitting: Internal Medicine

## 2023-04-18 MED ORDER — LEVOTHYROXINE 175 MCG TABLET
175.0000 ug | ORAL_TABLET | Freq: Every morning | ORAL | 5 refills | Status: DC
Start: 2023-04-18 — End: 2023-11-21

## 2023-04-18 NOTE — Telephone Encounter (Signed)
-   Levothyroxine 175mcg

## 2023-04-26 ENCOUNTER — Other Ambulatory Visit (HOSPITAL_BASED_OUTPATIENT_CLINIC_OR_DEPARTMENT_OTHER): Payer: Self-pay | Admitting: Internal Medicine

## 2023-04-26 MED ORDER — DEXCOM G7 SENSOR DEVICE
1.0000 | 3 refills | Status: DC
Start: 2023-04-26 — End: 2023-07-12

## 2023-05-03 IMAGING — MR MRI FOOT RT WO CONTRAST
4 of 6 series · 19 of 40 positions shown · IV contrast (gadolinium)
Comparison: Outside radiographs of the foot not available for comparison.

﻿EXAM:  [id number]   MRI FOOT RT WO CONTRAST
INDICATION: 47-year-old female sustained trauma due to fall in November 2022.  Persistent midfoot pain.  No prior surgery.
TECHNIQUE: Multiplanar, multisequential MRI of the right foot was performed without gadolinium contrast.

[Series 5: T1 · sagittal · right · 4.0mm · 0.57mm/px · 6 of 24 slices shown (1 of 3)]
[im 1/24]
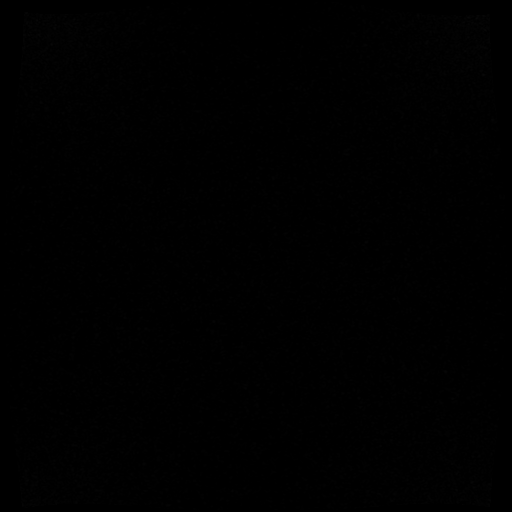
[im 5/24]
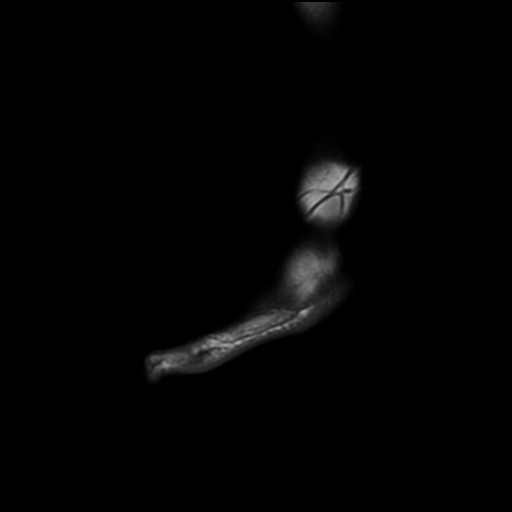
[im 10/24]
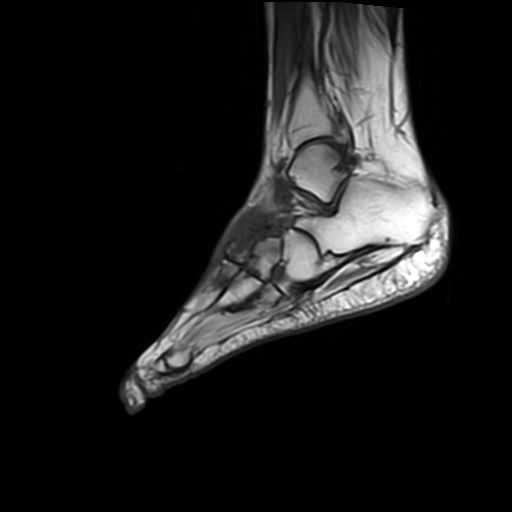
[im 14/24]
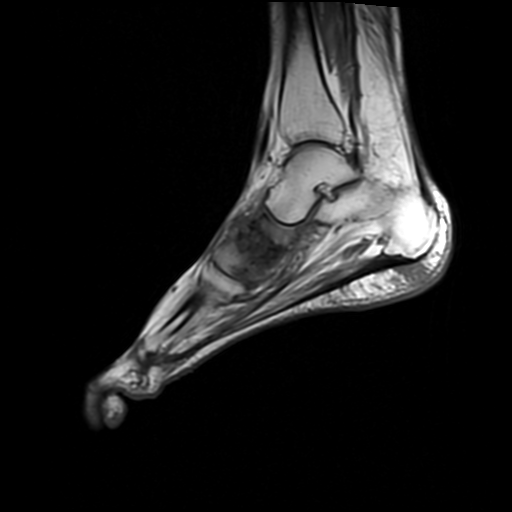
[im 19/24]
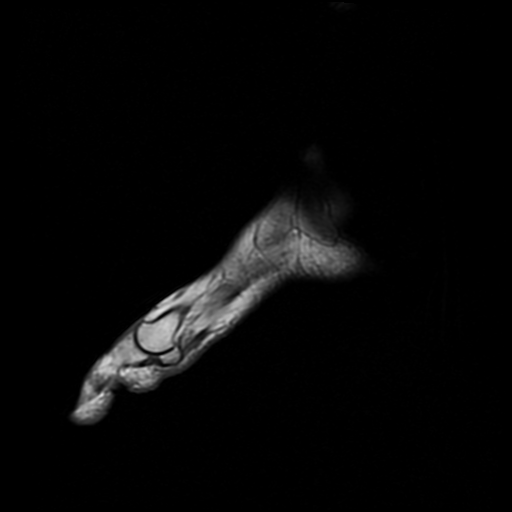
[im 24/24]
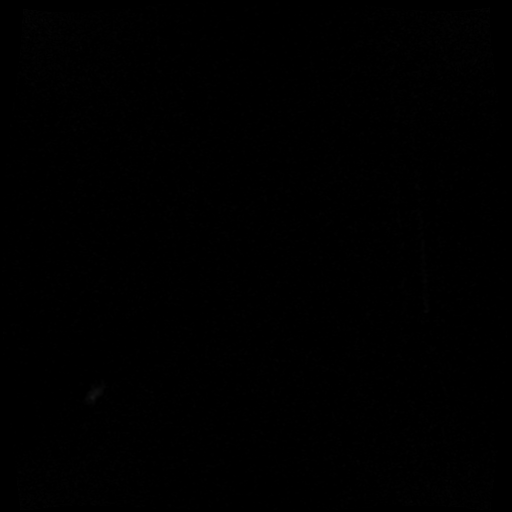

[Series 7: T1 · coronal · right · 5.5mm · 0.29mm/px · 4 of 33 slices shown (2 of 3)]
[im 1/33]
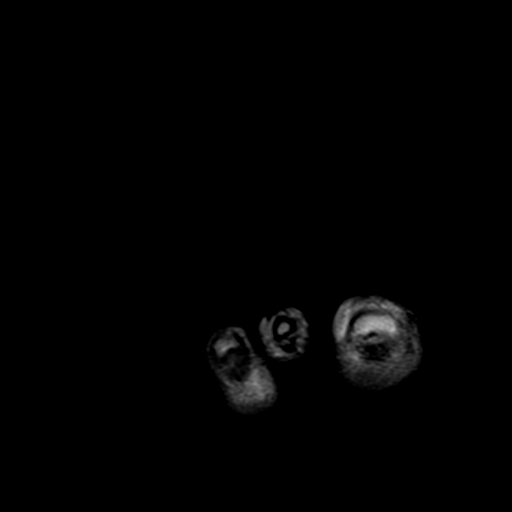
[im 5/33]
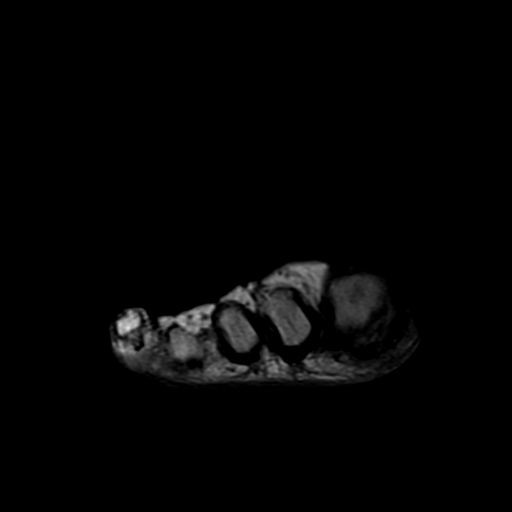
[im 19/33]
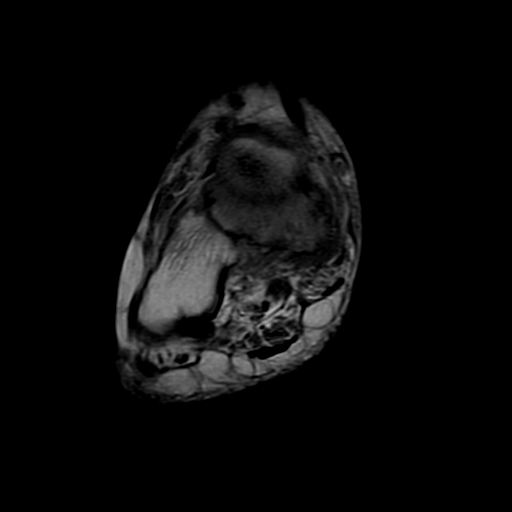
[im 28/33]
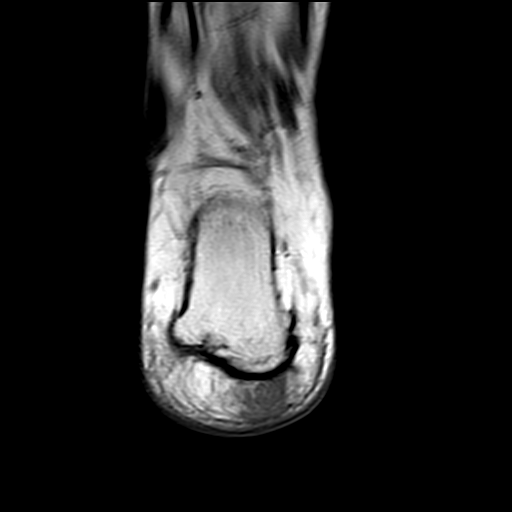

[Series 8: T1 · axial · right · 4.0mm · 0.49mm/px · z∈[-85,-14]mm · 3 of 22 slices shown (3 of 3)]
[im 5/22]
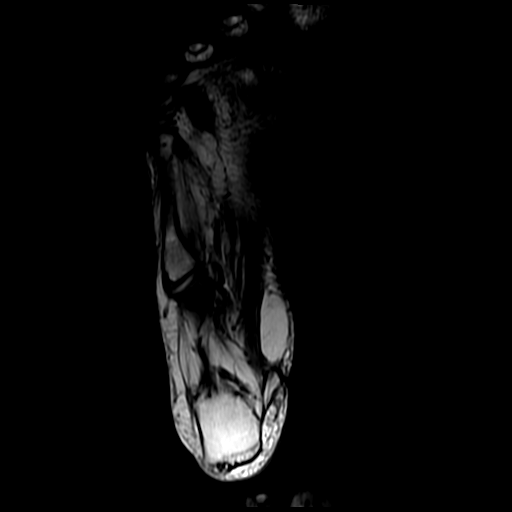
[im 13/22]
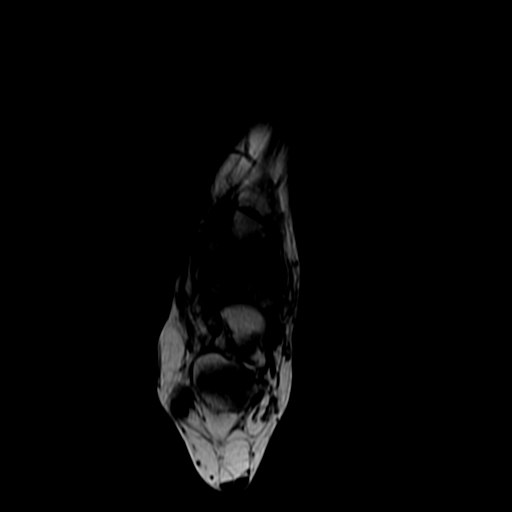
[im 22/22]
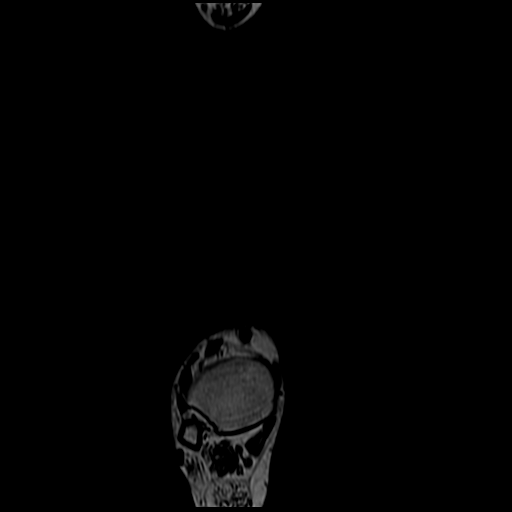

[Series 9: T2 fat-sat · axial · right · 4.0mm · 0.49mm/px · z∈[-102,-14]mm · 6 of 22 slices shown]
[im 1/22]
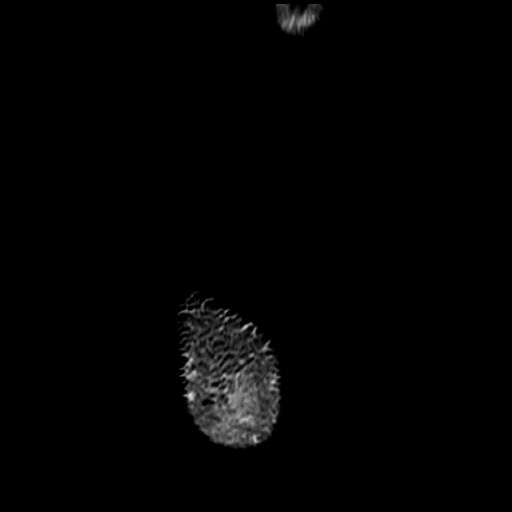
[im 5/22]
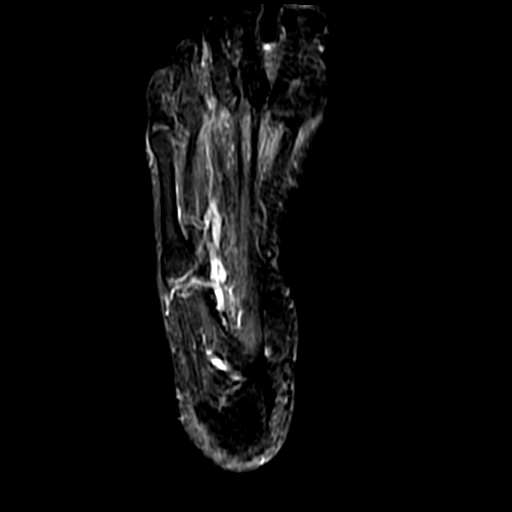
[im 9/22]
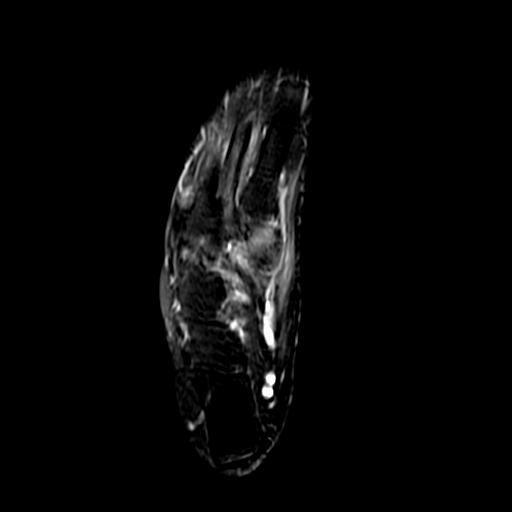
[im 13/22]
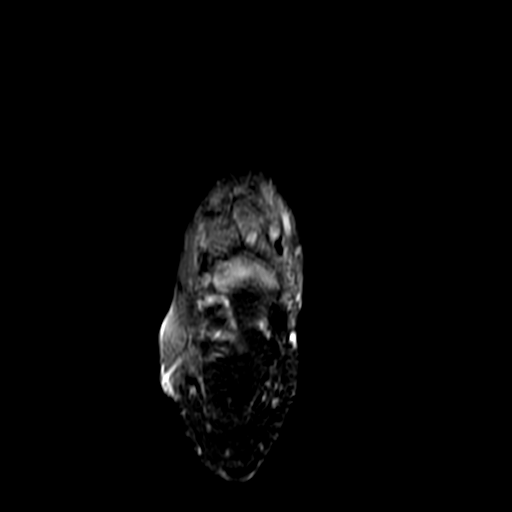
[im 17/22]
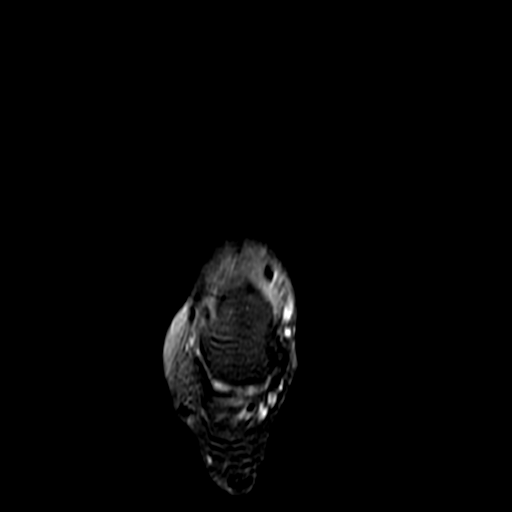
[im 22/22]
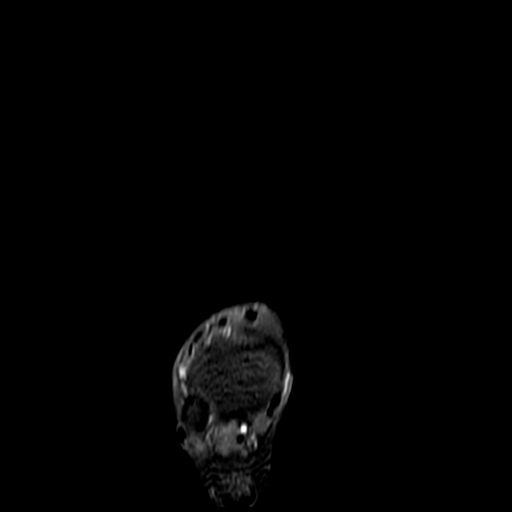

[19 of 40 positions shown; findings below may reference images not displayed]

FINDINGS: Bone marrow edema involving the navicular bone is noted as well as medial and middle cuneiform bones in the midfoot.  Surrounding soft tissues show inflammatory changes. Bone marrow edema extends to the base of the 2nd metatarsal bone.  These findings are likely posttraumatic in nature, representing acute or subacute posttraumatic changes. 

Arthritic changes of the midfoot are noted.  Tendons at the ankle are intact.  Plantar fascia is intact.

Some of the images are compromised in quality due to motion artifacts.
IMPRESSION: 1. Some of the sequences are compromised by motion artifacts. Overall quality is acceptable. 

2. Significant bone marrow edema involving the navicular bone, medial and middle cuneiform bones and proximal 2nd metatarsal bone are noted. These changes are likely due to acute or subacute changes of trauma.

3. Arthritic changes of the Lisfranc joints in the midfoot and mid tarsal joints.  Alignment of the bones of the Lisfranc joints appear to be normal.

## 2023-05-10 ENCOUNTER — Other Ambulatory Visit (HOSPITAL_BASED_OUTPATIENT_CLINIC_OR_DEPARTMENT_OTHER): Payer: Self-pay | Admitting: Internal Medicine

## 2023-05-10 MED ORDER — BD ULTRA-FINE NANO PEN NEEDLE 32 GAUGE X 5/32"
3 refills | Status: DC
Start: 2023-05-10 — End: 2024-03-20

## 2023-05-12 ENCOUNTER — Other Ambulatory Visit (HOSPITAL_BASED_OUTPATIENT_CLINIC_OR_DEPARTMENT_OTHER): Payer: Self-pay | Admitting: Internal Medicine

## 2023-05-12 MED ORDER — JARDIANCE 25 MG TABLET
25.0000 mg | ORAL_TABLET | Freq: Every morning | ORAL | 3 refills | Status: DC
Start: 2023-05-12 — End: 2023-11-21

## 2023-06-02 HISTORY — PX: PIP JOINT FUSION: SHX2238

## 2023-06-17 ENCOUNTER — Encounter (HOSPITAL_BASED_OUTPATIENT_CLINIC_OR_DEPARTMENT_OTHER): Payer: Self-pay | Admitting: Internal Medicine

## 2023-06-17 DIAGNOSIS — E890 Postprocedural hypothyroidism: Secondary | ICD-10-CM | POA: Insufficient documentation

## 2023-06-17 DIAGNOSIS — E559 Vitamin D deficiency, unspecified: Secondary | ICD-10-CM | POA: Insufficient documentation

## 2023-06-20 ENCOUNTER — Encounter (HOSPITAL_BASED_OUTPATIENT_CLINIC_OR_DEPARTMENT_OTHER): Payer: Self-pay | Admitting: Internal Medicine

## 2023-06-20 ENCOUNTER — Ambulatory Visit: Payer: Medicaid Other | Attending: Internal Medicine | Admitting: Internal Medicine

## 2023-06-20 ENCOUNTER — Other Ambulatory Visit: Payer: Self-pay

## 2023-06-20 VITALS — BP 123/62 | HR 91 | Temp 97.7°F | Ht 71.0 in | Wt 240.0 lb

## 2023-06-20 DIAGNOSIS — E1140 Type 2 diabetes mellitus with diabetic neuropathy, unspecified: Secondary | ICD-10-CM

## 2023-06-20 DIAGNOSIS — Z79890 Hormone replacement therapy (postmenopausal): Secondary | ICD-10-CM

## 2023-06-20 DIAGNOSIS — Z7985 Long-term (current) use of injectable non-insulin antidiabetic drugs: Secondary | ICD-10-CM

## 2023-06-20 DIAGNOSIS — E559 Vitamin D deficiency, unspecified: Secondary | ICD-10-CM | POA: Insufficient documentation

## 2023-06-20 DIAGNOSIS — Z7984 Long term (current) use of oral hypoglycemic drugs: Secondary | ICD-10-CM

## 2023-06-20 DIAGNOSIS — Z794 Long term (current) use of insulin: Secondary | ICD-10-CM | POA: Insufficient documentation

## 2023-06-20 DIAGNOSIS — E890 Postprocedural hypothyroidism: Secondary | ICD-10-CM | POA: Insufficient documentation

## 2023-06-20 DIAGNOSIS — E119 Type 2 diabetes mellitus without complications: Secondary | ICD-10-CM | POA: Insufficient documentation

## 2023-06-20 DIAGNOSIS — E785 Hyperlipidemia, unspecified: Secondary | ICD-10-CM

## 2023-06-20 MED ORDER — GLUCAGON 3 MG/ACTUATION NASAL SPRAY
1.0000 | Freq: Once | NASAL | 11 refills | Status: AC | PRN
Start: 2023-06-20 — End: ?

## 2023-06-20 NOTE — Patient Instructions (Signed)
patient advised to be compliant with medications and monitoring blood sugar  advised to have a yearly eye exam   advised about foot care and see a podiatrist if necessary  explained short term and long term complications of uncontrolled diabetes   discussed symptoms and signs of hypoglycemia and hyperglycemia and what to do in each situation   discussed targets for lipids  discussed target for BP  discussed targets for blood sugars and HBA1C taking consideration age ,duration of diabetes ,risk of hypoglycemia cardiac and neurological factors

## 2023-06-20 NOTE — Progress Notes (Addendum)
OUTPATIENT PROGRESS NOTE  CC: Diabetes Mellitus, type 2  Subjective:   The history is provided by the patient and the spouse.   Diabetes  She presents for her follow-up diabetic visit. She has type 2 diabetes mellitus. Her disease course has been stable. Hypoglycemia symptoms include confusion (when bs drops), speech difficulty (when bs drops), sweats (when bs drops) and tremors.      Kathy Gibson, Dec 11, 1975,  is a 47 y.o. female who presents to the endocrine clinic today for follow-up of Type 2 Diabetes Mellitus. The patient's diabetes is complicated by retinopathy, CAD, peripheral neuropathy, arthropathy, Charcot disease, hypoglycemia, and hypoglycemia unawareness. HgA1C 6.3%.     Has diabetes  hyperlipidemia and hypothyroidism s/p rai rx 2003   Diabetes diagnosed when had gestational in 1998 and then continued with diabetes   started on insulin 2020 on lantus 16u qam and novolog s/s has used 1x q month  on trulicity 3mg  q week and jardiance 25 mg off  metformin due to  diarhea    has 2 living children 1 son 3 and healthy and daughter born at 44 weeks from trauma to abdomen and has learning disability  since last visit 16m ago  ago   had foot surgery for charcot rt foot   Has been having seizure and had mri and saw neurologist   Has been having low BS at night cannot wake up    using dexcom and loves it   has arthritis in neck causing numbness in hands   has boot on rt foot is in therapy for  torn tendon   hba1c today 6.3 was  6.3   BS records reviewed  Lowest BS 30  Highest BS 260  Average BS 120  BS in office today 147    Hypoglycemia is occurring mostly at night 4x q week .  Last Diabetic Eye Exam: June 2024 was ok     ROS:   Review of Systems   Neurological:  Positive for tremors and speech difficulty (when bs drops).   Psychiatric/Behavioral:  Positive for confusion (when bs drops).             Labs:  Reviewed        Problem List / Past Medical History:     Patient Active Problem List   Diagnosis    Leukocytosis     Other specified disorders of white blood cells    Lupus (CMS HCC)    Essential hypertension    History of angina    Raynaud disease    Other hyperlipidemia    Asthma    Fibromyalgia    Migraines    Insomnia    Fatty liver    Gastroparesis    Controlled type 2 diabetes mellitus, with long-term current use of insulin (CMS HCC)    Pancreatitis    Hypothyroidism    Anxiety and depression    IBS (irritable bowel syndrome)    Easy bruising    Postablative hypothyroidism    Vitamin D deficiency        Medications:     Current Outpatient Medications   Medication Sig    ACCU-CHEK SOFTCLIX LANCETS Misc     atorvastatin (LIPITOR) 40 mg Oral Tablet Take 1 Tablet (40 mg total) by mouth Every evening for 90 days    BD INSULIN PEN NEEDLE UF 31 gauge x 5/16" Needle     BD UF NANO PEN NEEDLE 32 gauge x 5/32" Needle Use as directed to inject insulin  4xqd    bethanechol chloride (URECHOLINE) 5 mg Oral Tablet Take 1 Tablet (5 mg total) by mouth Three times a day    Blood-Glucose Sensor (DEXCOM G7 SENSOR) Does not apply Device 1 Device Every 10 days    clonazePAM (KLONOPIN) 1 mg Oral Tablet Take 1 Tablet (1 mg total) by mouth Three times a day    cyanocobalamin (VITAMIN B12) 1,000 mcg/mL Injection Solution Inject 1 mL (1,000 mcg total) under the skin Every 30 days    desvenlafaxine succinate (PRISTIQ) 50 mg Oral Tablet Sustained Release 24 hr Take 1 Tablet (50 mg total) by mouth Once a day    dicyclomine (BENTYL) 10 mg Oral Capsule Take 1 Capsule (10 mg total) by mouth Twice daily    ergocalciferol, vitamin D2, (DRISDOL) 1,250 mcg (50,000 unit) Oral Capsule Take 1 Capsule (50,000 Units total) by mouth Every 7 days    famotidine (PEPCID) 40 mg Oral Tablet Take 1 Tablet (40 mg total) by mouth Twice daily    FREESTYLE LANCETS 28 gauge Misc     gabapentin (NEURONTIN) 300 mg Oral Capsule Take 1 Capsule (300 mg total) by mouth Five times a day    glucagon (BAQSIMI) 3 mg per spray nasal spray Administer 1 Spray into affected nostril(s)  Once, as needed for Blood sugar below (70 with altered mental status, may repaet in 15 min if no response,) for up to 2 doses If no response after the 1st dose, dial 911.    hydrOXYchloroQUINE (PLAQUENIL) 200 mg Oral Tablet Take 1 Tablet (200 mg total) by mouth Twice daily    isosorbide mononitrate (IMDUR) 30 mg Oral Tablet Sustained Release 24 hr Take 1 Tablet (30 mg total) by mouth Every morning    JARDIANCE 25 mg Oral Tablet Take 1 Tablet (25 mg total) by mouth Every morning    LANTUS SOLOSTAR U-100 INSULIN 100 unit/mL (3 mL) Subcutaneous Insulin Pen Inject 16 Units under the skin Once a day    levothyroxine (SYNTHROID) 175 mcg Oral Tablet Take 1 Tablet (175 mcg total) by mouth Every morning    mirtazapine (REMERON) 15 mg Oral Tablet Take 1 Tablet (15 mg total) by mouth Every night    nitroGLYCERIN (NITROSTAT) 0.4 mg Sublingual Tablet, Sublingual     NOVOLOG FLEXPEN U-100 INSULIN 100 unit/mL (3 mL) Subcutaneous Insulin Pen     omeprazole (PRILOSEC) 40 mg Oral Capsule, Delayed Release(E.C.) Take 1 Capsule (40 mg total) by mouth Twice daily    ondansetron (ZOFRAN) 8 mg Oral Tablet Take 1 Tablet (8 mg total) by mouth Every 8 hours as needed for Nausea/Vomiting    traMADoL (ULTRAM) 50 mg Oral Tablet Take 1 Tablet (50 mg total) by mouth Every 4 hours as needed for Pain    TRULICITY 3 mg/0.5 mL Subcutaneous Pen Injector Inject 0.5 mL (3 mg total) under the skin Every 7 days    ZENPEP 40,000-126,000- 168,000 unit Oral Capsule, Delayed Release(E.C.) Once a day 3 pills a day        Allergies:     Allergies   Allergen Reactions    Cefdinir Anaphylaxis    Doxycycline Anaphylaxis    Meperidine Rash    Morphine Anaphylaxis    Nsaids (Non-Steroidal Anti-Inflammatory Drug) Hives/ Urticaria       Social History:     Social History     Tobacco Use    Smoking status: Former     Current packs/day: 0.00     Average packs/day: 0.5 packs/day for 12.0 years (6.0  ttl pk-yrs)     Types: Cigarettes     Start date: 2005     Quit date: 2017      Years since quitting: 7.6    Smokeless tobacco: Never   Vaping Use    Vaping status: Former    Substances: THC   Substance Use Topics    Alcohol use: Not Currently    Drug use: Yes     Types: Marijuana     Comment: legal pen to help with teeth pain       Objective:   BP 123/62   Pulse 91   Temp 36.5 C (97.7 F)   Ht 1.803 m (5\' 11" )   Wt 109 kg (240 lb)   SpO2 98%   BMI 33.47 kg/m       Physical Exam:  Physical Exam  Constitutional:       Appearance: Normal appearance. She is obese.   HENT:      Head: Normocephalic.   Neck:      Thyroid: No thyroid mass, thyromegaly (firm upper normal) or thyroid tenderness.   Cardiovascular:      Rate and Rhythm: Normal rate and regular rhythm.      Heart sounds: Normal heart sounds. No murmur heard.  Pulmonary:      Effort: Pulmonary effort is normal.      Breath sounds: Normal breath sounds.   Lymphadenopathy:      Cervical: No cervical adenopathy.   Neurological:      General: No focal deficit present.      Mental Status: She is alert.      Cranial Nerves: No cranial nerve deficit.   Psychiatric:         Attention and Perception: Attention normal.         Mood and Affect: Mood normal.         Speech: Speech normal.         Behavior: Behavior normal. Behavior is cooperative.         Cognition and Memory: Cognition and memory normal.         Judgment: Judgment normal.                    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  Andreas Blower MD

## 2023-06-22 ENCOUNTER — Telehealth (HOSPITAL_BASED_OUTPATIENT_CLINIC_OR_DEPARTMENT_OTHER): Payer: Self-pay | Admitting: Internal Medicine

## 2023-06-22 NOTE — Telephone Encounter (Signed)
-----   Message from Maryagnes Amos sent at 06/21/2023  2:28 PM EDT -----  Regarding: Lab results  Contact: [phone]  Please call to discuss.

## 2023-07-12 ENCOUNTER — Other Ambulatory Visit (HOSPITAL_BASED_OUTPATIENT_CLINIC_OR_DEPARTMENT_OTHER): Payer: Self-pay | Admitting: Internal Medicine

## 2023-07-12 MED ORDER — DEXCOM G7 SENSOR DEVICE
1.0000 | 3 refills | Status: DC
Start: 2023-07-12 — End: 2023-08-10

## 2023-07-18 ENCOUNTER — Other Ambulatory Visit (INDEPENDENT_AMBULATORY_CARE_PROVIDER_SITE_OTHER): Payer: Self-pay

## 2023-07-18 DIAGNOSIS — R569 Unspecified convulsions: Secondary | ICD-10-CM

## 2023-07-18 DIAGNOSIS — G629 Polyneuropathy, unspecified: Secondary | ICD-10-CM

## 2023-08-10 ENCOUNTER — Telehealth (HOSPITAL_BASED_OUTPATIENT_CLINIC_OR_DEPARTMENT_OTHER): Payer: Self-pay | Admitting: Internal Medicine

## 2023-08-10 ENCOUNTER — Other Ambulatory Visit (HOSPITAL_BASED_OUTPATIENT_CLINIC_OR_DEPARTMENT_OTHER): Payer: Self-pay | Admitting: Internal Medicine

## 2023-08-10 MED ORDER — DEXCOM G7 SENSOR DEVICE
1.0000 | 3 refills | Status: DC
Start: 2023-08-10 — End: 2023-11-21

## 2023-08-10 MED ORDER — LANCETS
1.0000 | Freq: Four times a day (QID) | 3 refills | Status: AC
Start: 2023-08-10 — End: ?

## 2023-08-10 NOTE — Telephone Encounter (Signed)
-----   Message from [place] S sent at 08/10/2023  8:44 AM EDT -----  Regarding: High Sugars  Contact: [phone]  Patient waking up with sugars in the 70's and the quickley jump to 268 or higher. Happening every morning. Please call [phone].

## 2023-08-16 ENCOUNTER — Telehealth (HOSPITAL_BASED_OUTPATIENT_CLINIC_OR_DEPARTMENT_OTHER): Payer: Self-pay | Admitting: Internal Medicine

## 2023-08-26 HISTORY — PX: PIP JOINT FUSION: SHX2238

## 2023-08-29 ENCOUNTER — Encounter (HOSPITAL_BASED_OUTPATIENT_CLINIC_OR_DEPARTMENT_OTHER): Payer: Self-pay | Admitting: Internal Medicine

## 2023-10-17 ENCOUNTER — Ambulatory Visit (HOSPITAL_BASED_OUTPATIENT_CLINIC_OR_DEPARTMENT_OTHER): Payer: Self-pay | Admitting: Internal Medicine

## 2023-11-11 ENCOUNTER — Other Ambulatory Visit (HOSPITAL_BASED_OUTPATIENT_CLINIC_OR_DEPARTMENT_OTHER): Payer: Self-pay | Admitting: Internal Medicine

## 2023-11-11 MED ORDER — INSULIN ASPART (U-100) 100 UNIT/ML (3 ML) SUBCUTANEOUS PEN
PEN_INJECTOR | SUBCUTANEOUS | 2 refills | Status: DC
Start: 2023-11-11 — End: 2024-03-20

## 2023-11-16 ENCOUNTER — Encounter (HOSPITAL_BASED_OUTPATIENT_CLINIC_OR_DEPARTMENT_OTHER): Payer: Self-pay | Admitting: Internal Medicine

## 2023-11-21 ENCOUNTER — Ambulatory Visit: Payer: BC Managed Care – PPO | Attending: Internal Medicine | Admitting: Internal Medicine

## 2023-11-21 ENCOUNTER — Other Ambulatory Visit (HOSPITAL_BASED_OUTPATIENT_CLINIC_OR_DEPARTMENT_OTHER): Payer: Self-pay | Admitting: Internal Medicine

## 2023-11-21 ENCOUNTER — Encounter (HOSPITAL_BASED_OUTPATIENT_CLINIC_OR_DEPARTMENT_OTHER): Payer: Self-pay | Admitting: Internal Medicine

## 2023-11-21 ENCOUNTER — Other Ambulatory Visit: Payer: Self-pay

## 2023-11-21 VITALS — BP 124/81 | HR 84 | Temp 97.0°F | Ht 71.0 in | Wt 250.0 lb

## 2023-11-21 DIAGNOSIS — Z7902 Long term (current) use of antithrombotics/antiplatelets: Secondary | ICD-10-CM

## 2023-11-21 DIAGNOSIS — E7849 Other hyperlipidemia: Secondary | ICD-10-CM | POA: Insufficient documentation

## 2023-11-21 DIAGNOSIS — E039 Hypothyroidism, unspecified: Secondary | ICD-10-CM | POA: Insufficient documentation

## 2023-11-21 DIAGNOSIS — Z79899 Other long term (current) drug therapy: Secondary | ICD-10-CM

## 2023-11-21 DIAGNOSIS — Z794 Long term (current) use of insulin: Secondary | ICD-10-CM | POA: Insufficient documentation

## 2023-11-21 DIAGNOSIS — Z79890 Hormone replacement therapy (postmenopausal): Secondary | ICD-10-CM

## 2023-11-21 DIAGNOSIS — E119 Type 2 diabetes mellitus without complications: Secondary | ICD-10-CM | POA: Insufficient documentation

## 2023-11-21 DIAGNOSIS — Z7984 Long term (current) use of oral hypoglycemic drugs: Secondary | ICD-10-CM

## 2023-11-21 DIAGNOSIS — E1140 Type 2 diabetes mellitus with diabetic neuropathy, unspecified: Secondary | ICD-10-CM

## 2023-11-21 DIAGNOSIS — E11319 Type 2 diabetes mellitus with unspecified diabetic retinopathy without macular edema: Secondary | ICD-10-CM

## 2023-11-21 MED ORDER — TRULICITY 3 MG/0.5 ML SUBCUTANEOUS PEN INJECTOR
3.0000 mg | PEN_INJECTOR | SUBCUTANEOUS | 5 refills | Status: DC
Start: 2023-11-21 — End: 2024-03-20

## 2023-11-21 MED ORDER — LEVOTHYROXINE 175 MCG TABLET: 175.0000 ug | ORAL_TABLET | Freq: Every morning | ORAL | 5 refills | Status: DC

## 2023-11-21 MED ORDER — EMPAGLIFLOZIN 25 MG TABLET
25.0000 mg | ORAL_TABLET | Freq: Every morning | ORAL | 5 refills | Status: DC
Start: 2023-11-21 — End: 2024-08-20

## 2023-11-21 MED ORDER — DEXCOM G7 SENSOR DEVICE
1.0000 | 5 refills | Status: DC
Start: 2023-11-21 — End: 2024-08-20

## 2023-11-21 NOTE — Procedures (Signed)
ENDOCRINOLOGY, [facility]  [address]  [place] [address]  Operated by [facility]  Procedure Note    Name: Kathy Gibson MRN:  [id number]   Date: 11/21/2023 DOB:  01/09/1976 (47 y.o.)         95251 - INTERPRETATION/REPORT CONTINUOUS GLUCOSE MONITORING VIA SUBCUTANEOUS SENSOR (AMB ONLY)    Performed by: Andreas Blower, MD  Authorized by: Andreas Blower, MD    cgm Sensor  Type dexcom  Average days of CGM 19/30  Average blood glucose 143  Standard Deviation 23%    86 % in range (70-180 mg/dl)  47% high (829 - 562 mg/dl)  1% very high (>130 mg/dl)  0% low (70 -54 mg/dl)  <1 % very low (<86 mg/dl)   BS higher night time but mostly great   Andreas Blower, MD

## 2023-11-21 NOTE — Patient Instructions (Signed)
patient advised to be compliant with medications and monitoring blood sugar  advised to have a yearly eye exam   advised about foot care and see a podiatrist if necessary  explained short term and long term complications of uncontrolled diabetes   discussed symptoms and signs of hypoglycemia and hyperglycemia and what to do in each situation   discussed targets for lipids  discussed target for BP  discussed targets for blood sugars and HBA1C taking consideration age ,duration of diabetes ,risk of hypoglycemia cardiac and neurological factors

## 2023-11-21 NOTE — Progress Notes (Signed)
OUTPATIENT PROGRESS NOTE  CC: Diabetes Mellitus, type 2  Subjective:   The history is provided by the patient and the spouse.   Diabetes  She presents for her follow-up diabetic visit. She has type 2 diabetes mellitus. Her disease course has been stable. Hypoglycemia symptoms include confusion (when bs drops), speech difficulty (when bs drops), sweats (when bs drops) and tremors. Associated symptoms include blurred vision, fatigue and foot paresthesias. Pertinent negatives for diabetes include no chest pain, no foot ulcerations and no weight loss (gained 10lb). Hypoglycemia complications include blackouts. Diabetic complications include heart disease, peripheral neuropathy and retinopathy. Pertinent negatives for diabetic complications include no autonomic neuropathy, CVA, nephropathy or PVD. Current diabetic treatment includes oral agent (dual therapy). She is compliant with treatment most of the time. Her weight is increasing steadily. She is following a generally healthy diet. Meal planning includes avoidance of concentrated sweets. She participates in exercise intermittently. Her home blood glucose trend is fluctuating minimally. An ACE inhibitor/angiotensin II receptor blocker is not being taken. She sees a podiatrist.Eye exam is current.      Ms. Icenhour, February 13, 1976,  is a 48 y.o. female who presents to the endocrine clinic today for follow-up of Type 2 Diabetes Mellitus. The patient's diabetes is complicated by retinopathy, CAD, peripheral neuropathy, arthropathy, Charcot disease, hypoglycemia, and hypoglycemia unawareness. HgA1C 6.7%.     Has diabetes  hyperlipidemia and hypothyroidism s/p rai rx 2003   Diabetes diagnosed when had gestational in 1998 and then continued with diabetes   started on insulin 2020 was on lantus 16u qam but has been off due to low BS  and novolog s/s has used it more q d almost 4u at most   on trulicity 3mg  q week and jardiance 25 mg off  metformin due to  diarhea    has 2 living  children 1 son 72 and healthy and daughter born at 12 weeks from trauma to abdomen and has learning disability  since last visit 46m ago  ago   had another foot surgery for charcot rt foot nov 2024 and under stress lost insurance for a while   Has been having low BS at night but goes right back up    using dexcom and loves it   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 Negative for weight loss (gained 10lb).   Eyes:  Positive for blurred vision.   Cardiovascular:  Negative for chest pain.   Neurological:  Positive for tremors and speech difficulty (when bs drops).   Psychiatric/Behavioral:  Positive for confusion (when bs drops).             Labs:  Reviewed    Aug 27/2024 hba1c 6.5 TSH 0.629    Problem List / Past Medical History:     Patient Active Problem List   Diagnosis    Leukocytosis    Other specified disorders of white blood cells    Lupus    Essential hypertension    History of angina    Raynaud  disease    Other hyperlipidemia    Asthma    Fibromyalgia    Migraines    Insomnia    Fatty liver    Gastroparesis    Controlled type 2 diabetes mellitus, with long-term current use of insulin (CMS HCC)    Pancreatitis    Hypothyroidism    Anxiety and depression    IBS (irritable bowel syndrome)    Easy bruising    Postablative hypothyroidism    Vitamin D deficiency        Medications:     Current Outpatient Medications   Medication Sig    atorvastatin (LIPITOR) 40 mg Oral Tablet Take 1 Tablet (40 mg total) by mouth Every evening for 90 days    BD INSULIN PEN NEEDLE UF 31 gauge x 5/16" Needle     BD UF NANO PEN NEEDLE 32 gauge x 5/32" Needle Use as directed to inject insulin  4xqd    bethanechol chloride (URECHOLINE) 5 mg Oral Tablet Take 1 Tablet (5 mg total) by mouth Three times a day    Blood-Glucose Sensor (DEXCOM G7 SENSOR) Does not apply Device 1 Device Every 10 days    clonazePAM (KLONOPIN) 1 mg Oral Tablet Take 1 Tablet (1 mg total) by mouth Three times a day    cyanocobalamin (VITAMIN B12) 1,000 mcg/mL Injection Solution Inject 1 mL (1,000 mcg total) under the skin Every 30 days    desvenlafaxine succinate (PRISTIQ) 50 mg Oral Tablet Sustained Release 24 hr Take 1 Tablet (50 mg total) by mouth Once a day    dicyclomine (BENTYL) 10 mg Oral Capsule Take 1 Capsule (10 mg total) by mouth Twice daily    empagliflozin (JARDIANCE) 25 mg Oral Tablet Take 1 Tablet (25 mg total) by mouth Every morning    ergocalciferol, vitamin D2, (DRISDOL) 1,250 mcg (50,000 unit) Oral Capsule Take 1 Capsule (50,000 Units total) by mouth Every 7 days    famotidine (PEPCID) 40 mg Oral Tablet Take 1 Tablet (40 mg total) by mouth Twice daily    gabapentin (NEURONTIN) 300 mg Oral Capsule Take 1 Capsule (300 mg total) by mouth Five times a day    glucagon (BAQSIMI) 3 mg per spray nasal spray Administer 1 Spray into affected nostril(s) Once, as needed for Blood sugar below (70 with altered mental status, may repaet in 15 min if no response,) for up to 2 doses If no response after the 1st dose, dial 911.    hydrOXYchloroQUINE (PLAQUENIL) 200 mg Oral Tablet Take 1 Tablet (200 mg total) by mouth Twice daily    insulin aspart U-100 (NOVOLOG FLEXPEN U-100 INSULIN) 100 unit/mL (3 mL) Subcutaneous Insulin Pen Use as directed per sliding scale, max dose 45u daily    isosorbide mononitrate (IMDUR) 30 mg Oral Tablet Sustained Release 24 hr Take 1 Tablet (30 mg total) by mouth Every morning    Lancets (MICROLET LANCET) Misc 1 Lancet Four times a day    levothyroxine (SYNTHROID) 175 mcg Oral Tablet Take 1 Tablet (175 mcg total) by mouth Every morning    mirtazapine (REMERON) 15 mg Oral Tablet Take 1 Tablet (15 mg total)  by mouth Every night    nitroGLYCERIN (NITROSTAT) 0.4 mg Sublingual Tablet, Sublingual     omeprazole (PRILOSEC) 40 mg Oral Capsule, Delayed Release(E.C.) Take 1 Capsule (40 mg total) by mouth Twice daily    ondansetron (ZOFRAN) 8 mg Oral Tablet Take 1 Tablet (8 mg total) by mouth Every 8 hours as needed for Nausea/Vomiting  REXULTI 1 mg Oral Tablet Take 1 Tablet (1 mg total) by mouth Once a day    TRULICITY 3 mg/0.5 mL Subcutaneous Pen Injector Inject 0.5 mL (3 mg total) under the skin Every 7 days    ZENPEP 40,000-126,000- 168,000 unit Oral Capsule, Delayed Release(E.C.) Once a day 3 pills a day        Allergies:     Allergies   Allergen Reactions    Cefdinir Anaphylaxis    Doxycycline Anaphylaxis    Meperidine Rash    Morphine Anaphylaxis    Nsaids (Non-Steroidal Anti-Inflammatory Drug) Hives/ Urticaria    Tape [Adhesive]     Tomatoes [Tomato]        Social History:     Social History     Tobacco Use    Smoking status: Former     Current packs/day: 0.00     Average packs/day: 0.5 packs/day for 12.0 years (6.0 ttl pk-yrs)     Types: Cigarettes     Start date: 2005     Quit date: 2017     Years since quitting: 8.0    Smokeless tobacco: Never   Vaping Use    Vaping status: Every Day    Substances: THC   Substance Use Topics    Alcohol use: Yes     Comment: occationally    Drug use: Yes     Types: Marijuana     Comment: legal pen to help with teeth pain       Objective:   BP 124/81   Pulse 84   Temp 36.1 C (97 F)   Ht 1.803 m (5\' 11" )   Wt 113 kg (250 lb)   SpO2 94%   BMI 34.87 kg/m       Physical Exam:  Physical Exam  Constitutional:       Appearance: Normal appearance. She is obese.   HENT:      Head: Normocephalic.   Neck:      Thyroid: No thyroid mass, thyromegaly (firm upper normal) or thyroid tenderness.   Cardiovascular:      Rate and Rhythm: Normal rate and regular rhythm.      Heart sounds: Normal heart sounds. No murmur heard.  Pulmonary:      Effort: Pulmonary effort is normal.      Breath  sounds: Normal breath sounds.   Lymphadenopathy:      Cervical: No cervical adenopathy.   Neurological:      General: No focal deficit present.      Mental Status: She is alert.      Cranial Nerves: No cranial nerve deficit.   Psychiatric:         Attention and Perception: Attention normal.         Mood and Affect: Mood normal.         Speech: Speech normal.         Behavior: Behavior normal. Behavior is cooperative.         Cognition and Memory: Cognition and memory normal.         Judgment: Judgment normal.                    Assessment & Plan:   (E11.9,  Z79.4) Controlled type 2 diabetes mellitus, with long-term current use of insulin (CMS HCC)  (primary encounter diagnosis)  Plan: POCT Blood Glucose/Fingerstick (AMB), POCT HGB         A1C, 95251 - INTERPRETATION/REPORT CONTINUOUS  GLUCOSE MONITORING VIA SUBCUTANEOUS SENSOR (AMB        ONLY), COMPREHENSIVE METABOLIC PNL, FASTING,         HGA1C (HEMOGLOBIN A1C WITH EST AVG GLUCOSE),         MICROALBUMIN/CREATININE RATIO, URINE, RANDOM,         LIPID PANEL, THYROID STIMULATING HORMONE WITH         FREE T4 REFLEX    (E03.9) Hypothyroidism, unspecified type  Plan: COMPREHENSIVE METABOLIC PNL, FASTING, HGA1C         (HEMOGLOBIN A1C WITH EST AVG GLUCOSE),         MICROALBUMIN/CREATININE RATIO, URINE, RANDOM,         LIPID PANEL, THYROID STIMULATING HORMONE WITH         FREE T4 REFLEX    (E78.49) Other hyperlipidemia  Plan: COMPREHENSIVE METABOLIC PNL, FASTING, HGA1C         (HEMOGLOBIN A1C WITH EST AVG GLUCOSE),         MICROALBUMIN/CREATININE RATIO, URINE, RANDOM,         LIPID PANEL, THYROID STIMULATING HORMONE WITH         FREE T4 REFLEX     Orders Placed This Encounter    95251 - INTERPRETATION/REPORT CONTINUOUS GLUCOSE MONITORING VIA SUBCUTANEOUS SENSOR (AMB ONLY)    COMPREHENSIVE METABOLIC PNL, FASTING    HGA1C (HEMOGLOBIN A1C WITH EST AVG GLUCOSE)    MICROALBUMIN/CREATININE RATIO, URINE, RANDOM    LIPID PANEL    THYROID STIMULATING HORMONE WITH FREE T4  REFLEX    POCT Blood Glucose/Fingerstick (AMB)    POCT HGB A1C    TRULICITY 3 mg/0.5 mL Subcutaneous Pen Injector    levothyroxine (SYNTHROID) 175 mcg Oral Tablet    empagliflozin (JARDIANCE) 25 mg Oral Tablet    Blood-Glucose Sensor (DEXCOM G7 SENSOR) Does not apply Device     Continue Novolog 2u -4 u if BS high >200 during day day do 2-3xqd   All in all doing well   Try to use dexcom all the time to help manege hypoglycemia as well as diabetes   Continue same   RTC 32m with labs   Andreas Blower MD

## 2023-12-21 ENCOUNTER — Encounter (INDEPENDENT_AMBULATORY_CARE_PROVIDER_SITE_OTHER): Payer: Self-pay

## 2023-12-21 ENCOUNTER — Telehealth (INDEPENDENT_AMBULATORY_CARE_PROVIDER_SITE_OTHER): Payer: Self-pay

## 2023-12-21 NOTE — Telephone Encounter (Signed)
 12/21/2023:  Patient called.  No answer and no voicemail.  Letter sent to home address to contact the Neuropsychology Clinic to schedule their referred evaluation.     [facility] Neuropsychology Service  [facility]   [address]  [place], [place]  [address]  Tel: [phone]

## 2023-12-30 ENCOUNTER — Emergency Department
Admission: EM | Admit: 2023-12-30 | Discharge: 2023-12-30 | Disposition: A | Discharge status: Home / Self Care | Source: Home / Self Care | Attending: Emergency Medicine | Admitting: Emergency Medicine

## 2023-12-30 ENCOUNTER — Other Ambulatory Visit: Payer: Self-pay

## 2023-12-30 ENCOUNTER — Encounter (HOSPITAL_BASED_OUTPATIENT_CLINIC_OR_DEPARTMENT_OTHER): Payer: Self-pay

## 2023-12-30 DIAGNOSIS — L03316 Cellulitis of umbilicus: Secondary | ICD-10-CM | POA: Insufficient documentation

## 2023-12-30 DIAGNOSIS — S31145A Puncture wound of abdominal wall with foreign body, periumbilic region without penetration into peritoneal cavity, initial encounter: Secondary | ICD-10-CM

## 2023-12-30 DIAGNOSIS — Z23 Encounter for immunization: Secondary | ICD-10-CM | POA: Insufficient documentation

## 2023-12-30 DIAGNOSIS — Z789 Other specified health status: Secondary | ICD-10-CM

## 2023-12-30 DIAGNOSIS — E669 Obesity, unspecified: Secondary | ICD-10-CM | POA: Insufficient documentation

## 2023-12-30 DIAGNOSIS — E119 Type 2 diabetes mellitus without complications: Secondary | ICD-10-CM | POA: Insufficient documentation

## 2023-12-30 DIAGNOSIS — L0390 Cellulitis, unspecified: Secondary | ICD-10-CM

## 2023-12-30 DIAGNOSIS — W268XXA Contact with other sharp object(s), not elsewhere classified, initial encounter: Secondary | ICD-10-CM

## 2023-12-30 DIAGNOSIS — T148XXA Other injury of unspecified body region, initial encounter: Secondary | ICD-10-CM

## 2023-12-30 HISTORY — DX: Other overlap syndromes: M35.1

## 2023-12-30 MED ORDER — CLINDAMYCIN HCL 150 MG CAPSULE
450.0000 mg | ORAL_CAPSULE | Freq: Three times a day (TID) | ORAL | 0 refills | Status: AC
Start: 2023-12-30 — End: 2024-01-06

## 2023-12-30 MED ORDER — SULFAMETHOXAZOLE 800 MG-TRIMETHOPRIM 160 MG TABLET
1.0000 | ORAL_TABLET | Freq: Two times a day (BID) | ORAL | 0 refills | Status: AC
Start: 2023-12-30 — End: 2024-01-06

## 2023-12-30 MED ORDER — DIPHTH,PERTUSSIS(ACEL),TETANUS 2.5 LF UNIT-8 MCG-5 LF/0.5ML IM SYRINGE
INJECTION | INTRAMUSCULAR | Status: AC
Start: 2023-12-30 — End: 2023-12-30
  Filled 2023-12-30: qty 0.5

## 2023-12-30 MED ORDER — DIPHTH,PERTUSSIS(ACEL),TETANUS 2.5 LF UNIT-8 MCG-5 LF/0.5ML IM SYRINGE
0.5000 mL | INJECTION | INTRAMUSCULAR | Status: AC
Start: 2023-12-30 — End: 2023-12-30
  Administered 2023-12-30: 0.5 mL via INTRAMUSCULAR

## 2023-12-30 MED ORDER — FLUCONAZOLE 150 MG TABLET
150.0000 mg | ORAL_TABLET | ORAL | 0 refills | Status: DC | PRN
Start: 2023-12-30 — End: 2024-03-20

## 2023-12-30 MED ORDER — MUPIROCIN 2 % TOPICAL OINTMENT
TOPICAL_OINTMENT | Freq: Three times a day (TID) | CUTANEOUS | 0 refills | Status: AC
Start: 2023-12-30 — End: 2024-01-06

## 2023-12-30 NOTE — ED Nurses Note (Signed)
 Patient discharged home with family.  AVS reviewed with patient/care giver.  A written copy of the AVS and discharge instructions was given to the patient/care giver. Scripts sent to pharmacy on file. Questions sufficiently answered as needed.  Patient/care giver encouraged to follow up with PCP as indicated.  In the event of an emergency, patient/care giver instructed to call 911 or go to the nearest emergency room. Pt verbalized understanding of discharge instructions. Denies any questions or concerns at this time. Respirations even and unlabored w/ no s/s of acute distress noted at this time.

## 2023-12-30 NOTE — ED Nurses Note (Signed)
 Wound cleaned with Hibiclens and saline. Wound marked with skin marker.

## 2023-12-30 NOTE — Discharge Instructions (Addendum)
 Keep area clean and dry.  Do not recommend body piercing of this area in the future.  Please report to the emergency department if the redness increases outside the marks made today.  Please report to the emergency department if any symptoms of concern develop.  You need to follow up with your primary care provider on Monday for a wound check.

## 2023-12-30 NOTE — ED Provider Notes (Signed)
 Emergency Medicine      Name: Kathy Gibson  Age and Gender: 48 y.o. female  Date of Birth: 07-31-1976  MRN: [id number]  PCP: Lilia Pro, FNP    CC:  Chief Complaint   Patient presents with    Wound Infection     Self piercing belly button one week ago.   Onset of redness 3 days post piercing.   Complains of redness, pain and malodorous drainage  at umbilicus.  Jewelry left intact.         HPI:  Kathy Gibson is a 48 y.o. White female who presents to the ER with naval piercing erythematous, indurated with purulent drainage.  Patient reports symptoms present x3 days.  Patient denies N/V/D/C.  Patient denies CP, SOB, LOC, fever, chills.  Assessment otherwise unremarkable.    Below pertinent information reviewed with patient:  Past Medical History:   Diagnosis Date    Anxiety and depression     Asthma     Diabetes mellitus, type 2 (CMS HCC)     Essential hypertension     Fatty liver     Fibromyalgia     Gastroparesis     History of angina     Hyperlipidemia     Hypothyroidism     IBS (irritable bowel syndrome)     Insomnia     Liver disease     fatty liver    Lupus     Migraines     Mixed connective tissue disease (CMS HCC)     Pancreatitis     Raynaud disease            Allergies   Allergen Reactions    Cefdinir Anaphylaxis    Doxycycline Anaphylaxis    Meperidine Rash    Morphine Anaphylaxis    Nsaids (Non-Steroidal Anti-Inflammatory Drug) Hives/ Urticaria    Tape [Adhesive]     Tomatoes [Tomato]        Past Surgical History:   Procedure Laterality Date    ABDOMINAL HERNIA REPAIR      CESAREAN SECTION      ESOPHAGEAL DILATION      HEEL SPUR SURGERY      HX BREAST LUMPECTOMY Left     benign    HX CHOLECYSTECTOMY      HX HYSTERECTOMY      HX LUMBAR FUSION      HX MENISCECTOMY      HX TUBAL LIGATION      PIP JOINT FUSION Right 06/02/2023    Mid Foot Joint Fusion    PIP JOINT FUSION Right 08/2023    With bone graph    REPLACEMENT TOTAL KNEE          Social History     Socioeconomic History    Marital status: Married    Tobacco Use    Smoking status: Former     Current packs/day: 0.00     Average packs/day: 0.5 packs/day for 12.0 years (6.0 ttl pk-yrs)     Types: Cigarettes     Start date: 2005     Quit date: 2017     Years since quitting: 8.1    Smokeless tobacco: Never   Vaping Use    Vaping status: Never Used   Substance and Sexual Activity    Alcohol use: Yes     Comment: occationally    Drug use: Yes     Types: Marijuana     Comment: legal pen to help with teeth pain  ROS:  No other overt positive review of systems are noted other than stated in the HPI.      Objective:    ED Triage Vitals [12/30/23 1525]   BP (Non-Invasive) 115/70   Heart Rate 71   Respiratory Rate 18   Temperature 36.4 C (97.5 F)   SpO2 95 %   Weight 111 kg (244 lb)   Height 1.803 m (5\' 11" )     Filed Vitals:    12/30/23 1525 12/30/23 1720   BP: 115/70 107/72   Pulse: 71 62   Resp: 18 16   Temp: 36.4 C (97.5 F) 36.5 C (97.7 F)   SpO2: 95% 97%       Nursing notes and vital signs reviewed.    Constitutional - No acute distress.  Alert and Active.  HEENT - Normocephalic. Atraumatic. PERRL. EOMI. Conjunctiva clear. Moist mucous membranes.   Neck - Trachea midline. No stridor. No hoarseness.  Cardiac - Regular rate and rhythm. Intact distal pulses.  Respiratory/Chest - Normal respiratory effort.  Abdomen - Normal bowel sounds. Non-tender, soft, non-distended (rotund). No rebound or guarding.   Musculoskeletal - Good AROM. No muscle or joint tenderness appreciated. No clubbing, cyanosis or edema.  Skin - Warm and dry.  Erythematous and indurated piercing site noted at the umbilicus with purulent drainage.  Tenderness on palpation.  Neuro - Alert and oriented x 3. Cranial nerves II-XII are grossly intact.  Moving all extremities symmetrically. Normal gait.  Psych - Normal mood and affect. Behavior is normal        Any pertinent labs and imaging obtained during this encounter reviewed below in MDM.    MDM/ED Course:    Medical Decision Making  [name] is a 48 y.o. White female who presents to the ER with naval piercing erythematous, indurated with purulent drainage.  Patient reports symptoms present x3 days.  Patient denies N/V/D/C.  Patient denies CP, SOB, LOC, fever, chills.  Assessment otherwise unremarkable.    Differential diagnosis include but are not limited to: Abscess, cellulitis, MRSA, foreign body retained in skin    Diagnostics will include aerobic and anaerobic cultures.    Discussed with ED attending.    Removed jewelry present.  Patient tolerated well and expressed relief of discomfort.  Discussed with patient wound care.  Advised belly button piercing in the future would more than likely end up the same way.  Discussed how the jewelry is trapped between skin folds due to larger body habitus.  This creates constant tension infection on the skin.  With patient's history of being a type 2 diabetic along with obesity, body piercing is not favorable.  All questions answered to patient's satisfaction.  Treating for cellulitis.  Patient advised to return to the emergency department if erythema exceeds parameters drawn on skin today.  Patient advised to return to the emergency department if any other symptoms of concern develop.  Patient advised to follow up with her primary care provider on Monday for wound check to ensure healing properly.  Patient will be treated with topical antibiotic as well as oral antibiotic.    Patient indicated and verbalized understanding and agreement.    Amount and/or Complexity of Data Reviewed  Labs: ordered.            Orders Placed This Encounter    WOUND, SUPERFICIAL/NON-STERILE SITE, AEROBIC CULTURE AND GRAM STAIN    ANAEROBIC CULTURE    diphtheria, pertussis-acell, tetanus (BOOSTRIX) IM injection    mupirocin (BACTROBAN)  2 % Ointment    clindamycin (CLEOCIN) 150 mg Oral Capsule    trimethoprim-sulfamethoxazole (BACTRIM DS) 160-800mg  per tablet    fluconazole (DIFLUCAN) 150 mg Oral Tablet         Any  procedures:  Procedures    Impression:   Clinical Impression   Body piercing (Primary)   Wound infection   Cellulitis, unspecified cellulitis site       Disposition: Discharged    / Corinna Lines, FNP-BC  12/30/2023, 15:11  [facility]  Department of Emergency Medicine  [facility]    Portions of this note may have been dictated using voice recognition software.     -----------------------  No results found for this or any previous visit (from the past 12 hours).  No orders to display

## 2024-01-02 LAB — WOUND, SUPERFICIAL/NON-STERILE SITE, AEROBIC CULTURE AND GRAM STAIN

## 2024-01-02 NOTE — ED Notes (Signed)
 [facility], [facility] - Emergency Department  Peer Recovery Coach Assessment    Initial Evaluation  Referred by:: Nurse  Location of Evaluation: Emergency Department  How many times in the last 12 months have you been to the ED?: 1  Have you ever served or are you currently serving in the Armed Forces?: No             Substance Use History  Patient current substance use status: Patient sttaed she smokes marijuana and has a medical marijuana card. Safe use  and long-term affects were discussed.    Prior treatment history?: No    Currently enrolled in substance use program?: No    Within the last 30 days, what substances has the patient used?: Marijuana  Patient's age at first substance use?: 6 or older  Drug route of administration: Smoked    Has the patient ever had sustained abstinence?: No              Family, Social, Home & Safety History  Marital Status: Married            Need to improve relationships with family?: No    Social network: Immediate family, Non-substance using peers/friends/other    Current living situation: With family  Any help needed with the following?: None  Contact phone number for the patient: [phone]       Has the patient had any legal issues within the past 30 days?: None         Employment            Engagement  Readiness ruler: 1  Summary of assessment priority areas: Other    Brief Intervention  Discussed plan to reduce/quit substance use?: Yes  Discussed willingness to enter treatment?: Yes  Indicated patient's stage of change:: 1 - Precontemplation    Patient seen by Peer Recovery Coach and is a candidate for buprenorphine administration in the ED. Patient needs assessment for bup treatment.: No (non opioid user)    Plan  Was the patient referred to treatment?: No    Was patient referred to physician for Buprenorphine Assessment in the ED?: No (non opioid user)    Did patient receive Narcan in the ED?: No         Follow-up  Patient admitted for treatment?:  No        Need for additional follow-up?: No       Haynes Bast, Peer Recovery Coach 01/02/2024 09:44

## 2024-01-03 ENCOUNTER — Ambulatory Visit (HOSPITAL_BASED_OUTPATIENT_CLINIC_OR_DEPARTMENT_OTHER): Payer: Self-pay | Admitting: Physician Assistant

## 2024-01-04 LAB — ANAEROBIC CULTURE

## 2024-01-30 ENCOUNTER — Other Ambulatory Visit: Payer: Self-pay

## 2024-01-31 ENCOUNTER — Ambulatory Visit (INDEPENDENT_AMBULATORY_CARE_PROVIDER_SITE_OTHER)

## 2024-01-31 ENCOUNTER — Other Ambulatory Visit: Payer: Self-pay

## 2024-01-31 ENCOUNTER — Ambulatory Visit: Payer: Self-pay

## 2024-01-31 DIAGNOSIS — G629 Polyneuropathy, unspecified: Secondary | ICD-10-CM | POA: Insufficient documentation

## 2024-01-31 DIAGNOSIS — F411 Generalized anxiety disorder: Secondary | ICD-10-CM

## 2024-01-31 DIAGNOSIS — R569 Unspecified convulsions: Secondary | ICD-10-CM | POA: Insufficient documentation

## 2024-01-31 DIAGNOSIS — G3184 Mild cognitive impairment, so stated: Secondary | ICD-10-CM

## 2024-01-31 DIAGNOSIS — F332 Major depressive disorder, recurrent severe without psychotic features: Secondary | ICD-10-CM

## 2024-02-01 NOTE — Progress Notes (Signed)
 Neuropsychological Assessment Report    PATIENT NAME: Kathy Gibson   MRN: [id number]   DOB: [birth year]   DOS: 2024/02/21    CPT CODES:  1 x 40347 - diagnostic interview (2024/02/21, 08:07-09:07)  1 x 42595 - neuropsychological testing by technician ([facility]), 1st 30 mins (Feb 21, 2024, 09:18-09:48)  3 x 63875 - neuropsychological testing and scoring by technician ([facility], [facility]), each additional 30 mins (21-Feb-2024, 09:48-10:50; 02/01/2024, 07:36-08:01)  1 x 64332 - neuropsychological testing evaluation services (record review, integration, report writing), 1st hour (2024/02/21, 09:15-10:15)  1 x 95188 - neuropsychological testing evaluation services (record review, integration, report writing, feedback session), each additional hour (02/01/2024, 14:00-15:00)      The patient is a 48 y.o., right-handed White woman.    Statement of Medical Necessity: The patient presents with cognitive and emotional difficulties. Therefore, she was referred for a comprehensive neuropsychological evaluation, including an assessment of her current cognitive and emotional functioning, to establish, clarify, and differentiate diagnoses, as well as to determine appropriate treatment recommendations.    Background: Kathy Gibson is currently experiencing cognitive and emotional difficulties.  Current cognitive difficulties reportedly began five years ago, with gradual decline over time, and include word-finding problems, stuttering, difficulty maintaining attention, losing track in conversations, difficulty completing tasks, impulsivity, misplacing items, difficulty recalling recently learned information, and disorganization. Current emotional difficulties include symptoms of anxiety, depression, posttraumatic stress, and psychosis. Kathy Gibson is reportedly independent for all basic and instrumental tasks of daily living; she reported occassionally forgetting to use the restroom and forgetting to eat. Driving difficulties were generally denied. Sleep  problems include longstanding difficulty initiating and maintaining sleep, and restless legs symptoms. Physical problems include chronic pain, migraines, dizziness, vision changes, hearing loss in the left ear, occasional bilateral hand tremor, neuropathy (bilateral legs, hands, and arms), dropping items, decreased motor dexterity, bumping into objects, increased clumsiness, and balance problems.    Medical History:  Current Medications:   Current Outpatient Medications   Medication Sig    atorvastatin (LIPITOR) 40 mg Oral Tablet Take 1 Tablet (40 mg total) by mouth Every evening for 90 days    BD INSULIN PEN NEEDLE UF 31 gauge x 5/16" Needle     BD UF NANO PEN NEEDLE 32 gauge x 5/32" Needle Use as directed to inject insulin 4xqd    bethanechol chloride (URECHOLINE) 5 mg Oral Tablet Take 1 Tablet (5 mg total) by mouth Three times a day    Blood-Glucose Sensor (DEXCOM G7 SENSOR) Does not apply Device 1 Device Every 10 days    clonazePAM (KLONOPIN) 1 mg Oral Tablet Take 1 Tablet (1 mg total) by mouth Three times a day    cyanocobalamin (VITAMIN B12) 1,000 mcg/mL Injection Solution Inject 1 mL (1,000 mcg total) under the skin Every 30 days    desvenlafaxine succinate (PRISTIQ) 50 mg Oral Tablet Sustained Release 24 hr Take 1 Tablet (50 mg total) by mouth Once a day    dicyclomine (BENTYL) 10 mg Oral Capsule Take 1 Capsule (10 mg total) by mouth Twice daily    empagliflozin (JARDIANCE) 25 mg Oral Tablet Take 1 Tablet (25 mg total) by mouth Every morning    ergocalciferol, vitamin D2, (DRISDOL) 1,250 mcg (50,000 unit) Oral Capsule Take 1 Capsule (50,000 Units total) by mouth Every 7 days    famotidine (PEPCID) 40 mg Oral Tablet Take 1 Tablet (40 mg total) by mouth Twice daily    fluconazole (DIFLUCAN) 150 mg Oral Tablet Take 1 Tablet (150 mg total) by mouth Every 72 hours  as needed for up to 2 doses Please take 1 tablet by mouth with onset of vaginal yeast symptoms.  May repeat in 72 hours (3 days) if symptoms persist.     gabapentin (NEURONTIN) 300 mg Oral Capsule Take 1 Capsule (300 mg total) by mouth Five times a day    glucagon (BAQSIMI) 3 mg per spray nasal spray Administer 1 Spray into affected nostril(s) Once, as needed for Blood sugar below (70 with altered mental status, may repaet in 15 min if no response,) for up to 2 doses If no response after the 1st dose, dial 911.    hydrOXYchloroQUINE (PLAQUENIL) 200 mg Oral Tablet Take 1 Tablet (200 mg total) by mouth Twice daily    insulin aspart U-100 (NOVOLOG FLEXPEN U-100 INSULIN) 100 unit/mL (3 mL) Subcutaneous Insulin Pen Use as directed per sliding scale, max dose 45u daily    isosorbide mononitrate (IMDUR) 30 mg Oral Tablet Sustained Release 24 hr Take 1 Tablet (30 mg total) by mouth Every morning    Lancets (MICROLET LANCET) Misc 1 Lancet Four times a day    levothyroxine (SYNTHROID) 175 mcg Oral Tablet Take 1 Tablet (175 mcg total) by mouth Every morning    mirtazapine (REMERON) 15 mg Oral Tablet Take 1 Tablet (15 mg total) by mouth Every night    nitroGLYCERIN (NITROSTAT) 0.4 mg Sublingual Tablet, Sublingual     omeprazole (PRILOSEC) 40 mg Oral Capsule, Delayed Release(E.C.) Take 1 Capsule (40 mg total) by mouth Twice daily    ondansetron (ZOFRAN) 8 mg Oral Tablet Take 1 Tablet (8 mg total) by mouth Every 8 hours as needed for Nausea/Vomiting    REXULTI 1 mg Oral Tablet Take 1 Tablet (1 mg total) by mouth Once a day    TRULICITY 3 mg/0.5 mL Subcutaneous Pen Injector Inject 0.5 mL (3 mg total) under the skin Every 7 days    ZENPEP 40,000-126,000- 168,000 unit Oral Capsule, Delayed Release(E.C.) Once a day 3 pills a day      Allergies:   Allergies   Allergen Reactions    Cefdinir Anaphylaxis    Doxycycline Anaphylaxis    Meperidine Rash    Morphine Anaphylaxis    Nsaids (Non-Steroidal Anti-Inflammatory Drug) Hives/ Urticaria    Tape [Adhesive]     Tomatoes [Tomato]       Past Medical History:   Past Medical History:   Diagnosis Date    Anxiety and depression     Asthma      Diabetes mellitus, type 2     Essential hypertension     Fatty liver     Fibromyalgia     Gastroparesis     History of angina     Hyperlipidemia     Hypothyroidism     IBS (irritable bowel syndrome)     Insomnia     Liver disease     fatty liver    Lupus     Migraines     Mixed connective tissue disease (CMS HCC)     Pancreatitis     Raynaud disease        Past Surgical History:   Family Medical History:       Problem Relation (Age of Onset)    Breast Cancer Maternal Grandmother, Paternal Grandmother    Cervical Cancer Mother    Heart Attack Paternal Grandmother    Stroke Maternal Grandmother           Past Family History:   Family Medical History:  Problem Relation (Age of Onset)    Breast Cancer Maternal Grandmother, Paternal Grandmother    Cervical Cancer Mother    Heart Attack Paternal Grandmother    Stroke Maternal Grandmother             Kathy Gibson reportedly experienced a seizure last year, consisting of approximately 10 seconds of eyelid fluttering and mouth movements. She denied loss of consciousness, and reported believing she was speaking during the episode, but her spouse stated she made no sound despite mouth movements. She reported that she felt frozen in place during the episode.     Kathy Gibson reportedly underwent five-day EEG study, but results were not available for review.    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 No records were available for review.    Neuroimaging:  MRI of the brain dated 05/25/2023 completed at an outside facility: FINDINGS: DWI demonstrates no evidence of acute ischemic infarction or restricted water. Intact corpus callosum. Optic chiasm, pituitary gland and cerebellar tonsilar position are normal. Normal appearance of bilateral 7th and 8th nerve complexes. No abnormal signal in the mastoid air cells. Bilateral ethmoid sinus opacification. Intact bilateral orbital globes and lenses. Normal flow void of the vessels. Age appropriate ventricles and  cortical sulci. Confluent T2/FLAIR high signal adjacent to the frontal horn of the left lateral ventricle with adjacent minimal punctate high signal in the left frontal lobe. GRE demonstrates no evidence of microhemorrhage. Postcontrast imaging reveals no enhancing masses, vascular malformation or meningeal enhancement. No calvarial or scalp abnormality.    Psychiatric History: Kathy Gibson reported a longstanding history of emotional distress. Symptoms of anxiety include worry about many different situations, catastrophization, rumination, and panic-like symptoms associated with stress. Symptoms of depression include low mood, loneliness, loss of energy, loss of interest, low motivation, poor self-image, self-deprecating statements, and suicidal ideation. Symptoms of posttraumatic stress include re-experiencing, avoidance behaviors, hypervigilence, hyperarousal, and changes in mood and self-image with triggers. Symptoms of psychosis reportedly began one-and-a-half years ago, and include hearing multiple voices whispering. Kathy Gibson is currently prescribed clonazepam, Pristiq, gabapentin, Synthroid, mirtazepine, and Rexulti. Current participation in psychotherapy services was denied. She reported passive suicidal ideation, most recently one year ago; she denied a history of suicide attempts. Current suicidal and homicidal ideation, plan, or intent was denied. Evidence of delusional thinking was not observed. Psychiatric hospitalizations were denied. She reporteds rare alcohol use (once or twice per year). She reportedly vapes marijuana daily, most recently the morning of the evaluation. Current use of tobacco and illicit substances was denied.     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 Learning difficulties, grade retention, and special education services were denied. She is currently employed full-time in Science [employer]. She is married to  her second husband (6 years), and has two adult children and one deceased daughter. She currently resides with her husband in a single-family home.    Tests Administered:  Dot Counting Test., Test of Premorbid Functioning, Mini Mental State Examination, WAIS-IV (Similarities, Matrix Reasoning, Digit Span, Coding), RB[employer] Chief Operating Officer), Trail Making Test [employer], [employer], [employer], NAB (Naming), Controlled Oral Word Association Test (FAS, Animals), [employer], WMS-IV (Logical Memory), Rey Complex Figure Test, Beck Depression Inventory-2, Beck Anxiety Inventory    Mental Status Exam & Behavioral Observations:  Mental Status: 15 minutes early, and was accompanied by her husband Orientation: Oriented to person, place, time, and situation. Appearance: Culturally and context  appropriate. Grooming and Hygiene: Good.  Posture: Unremarkable Gait: Unremarkable. Motor: Unremarkable. Hearing/Vision: Adequate Speech: Fluent and normal in volume, tone, rate, syntax, and prosody, with word-finding difficulties. Comprehension: Intact Interpersonal Behavior: Appropriate. Presented as friendly, open, and cooperative. Rapport with the examiner was easily established and maintained throughout the evaluation. Eye Contact: Appropriate Mood: Euthymic Affect: Mood-congruent Thoughts: Mildly impulsive and tangential. No evidence of active hallucinations, delusions, or psychotic processes. Insight and Judgment: Good Remote Memory: Intact. Good historian. Effort: Given performance on embedded and formal measures of effort, results are considered to be a valid estimate of current functioning in all areas.    Results:  Effort:  Effort testing met expectations.     Estimated Intellectual Functioning:  General intellectual abilities were estimated to fall within the Average range.    Language:  Confrontation naming was Low Average to Average. Phonemic fluency was Low Average. Semantic fluency  was Average.    Visuospatial Skills:  Visuoperceptive skills were High Average. Visuoconstruction skills were Exceptionally Low. She used a generally well-organized approach, with adequate gestalt formation, but made significant errors in detail integration.    Attention:  Basic auditory attention was Average. Auditory working memory was Average. Auditory sequencing was Average. Processing speed fell in the Average range. A task of visual sequencing fell in the Average range, with no errors. An additional task of sequencing and set-shifting was Low Average, withone set-loss error.    Memory:  Encoding of low context information was Below Average. Kathy Gibson was able to recall 2, 5, 8, 9, and 10 words from a 16-item list across respective trials. They made 2 intrusion errors. After a distraction task, Kathy Gibson was able recall a Low Average amount of information. After a long delay, recall of low context information fell in the Average range. Recognition of low context information was Average. Encoding of high context information was Average. Recall of high context information was Average. Recognition of high context information was High Average. Recall of visual information was Below Average after a short delay, and Low Average after a long delay, with High Average recognition.    Executive Functioning:  Verbal abstract reasoning was Average. Visual abstract reasoning was Average. Non-verbal concept formation was Average, and Kathy Gibson made an Average number of perseverative responses. She made 1 set-loss error, which is within normal limits.    Emotional Functioning:  A screening measure of anxiety fell in the severely anxious range. A screening measure of depression fell in the severely depressed range.    Summary: Effort testing met expectations. Intellectual functioning was Average. Language skills were intact. Visuospatial skills were variable, with Exceptionally Low visuoconstruction. Attention was intact. Memory was  variable, Below Average encoding and immediate recall of low context information, and Below Average immediate recall of visual information. Executive functioning met expectations. Severe symptoms of emotional distress were endorsed on formal measures.     Impression: The most notable results of the current evaluation were variable performances on tasks of visuospatial skills and memory. 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 Given results of neuroimaging, concern may be warranted for a neuroinflammatory, autoimmune, or neurovascular process; continued evaluation is strongly recommended. Cognitive difficulties are likely exacerbated by emotional distress.     Given reported longstanding symptoms of anxiety, a diagnosis of Generalized Anxiety Disorder (F41.1) is provided. 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     Recommendations:   Continued neurological evaluation (e.g., MRI, PET) and/or testing  for neurodegenerative disease-related biomarkers (e.g., tau, genetic mutations) may be considered as part of a comprehensive workup. Providers may also consider autoimmune, autoinflammatory, or genetic processes to explain current symptoms.  Optimal management of vascular risk factors is strongly encouraged.   Kathy Gibson should follow up with Cardiology on a regular basis in order to maintain vascular health.   A healthy diet and regular exercise are encouraged to reduce the risk of future strokes.  Consultation with psychiatry is encouraged to assist in medication management of mood and anxiety difficulties. Providers are encouraged to regularly review medications for efficaciousness, and consider the potential for negative cognitive side-effects.   Regular participation in psychotherapy is recommended to assist in managing anxiety and depression symptoms.   Additionally, Kathy Gibson may benefit from using sleep hygiene  techniques in order to improve sleep quality. More information is available at the Sleep Foundation (https://www.sleepfoundation.org/).  Kathy Gibson is encouraged to use the following coping strategies to manage attention, memory, and executive functioning difficulties.   Use of a structured schedule or routines.   Reduce attempts to multitask. Deal with only one idea or task at a time.  Use of calendars, list-making, and electronic reminders.  Additionally, Kathy Gibson might consider the following activities to boost her mood and reduce anxiety:  Scheduling enjoyable activities into her day  Practicing relaxation strategies like diaphragmatic breathing, progressive muscle relaxation, or mindfulness meditation  Engaging in daily exercise  Joining social groups in her community  Volunteering in her community  A repeated neuropsychological evaluation in 12-24 months may be considered to assess for changes in cognition and provide updated recommendations for changing needs.          Kathy Gibson Doctor, Psy.D.  Assistant Professor    [facility]

## 2024-02-13 ENCOUNTER — Ambulatory Visit

## 2024-02-13 ENCOUNTER — Other Ambulatory Visit: Payer: Self-pay

## 2024-02-13 DIAGNOSIS — R404 Transient alteration of awareness: Secondary | ICD-10-CM

## 2024-02-13 DIAGNOSIS — E7849 Other hyperlipidemia: Secondary | ICD-10-CM | POA: Insufficient documentation

## 2024-02-13 DIAGNOSIS — Z794 Long term (current) use of insulin: Secondary | ICD-10-CM | POA: Insufficient documentation

## 2024-02-13 DIAGNOSIS — E039 Hypothyroidism, unspecified: Secondary | ICD-10-CM | POA: Insufficient documentation

## 2024-02-13 DIAGNOSIS — E119 Type 2 diabetes mellitus without complications: Secondary | ICD-10-CM | POA: Insufficient documentation

## 2024-02-13 LAB — MICROALBUMIN/CREATININE RATIO, URINE, RANDOM
CREATININE RANDOM URINE: 78 mg/dL (ref 30–125)
MICROALBUMIN RANDOM URINE: <0.7 mg/dL

## 2024-02-17 ENCOUNTER — Ambulatory Visit

## 2024-02-17 ENCOUNTER — Other Ambulatory Visit: Payer: Self-pay

## 2024-02-17 DIAGNOSIS — G3184 Mild cognitive impairment, so stated: Secondary | ICD-10-CM | POA: Insufficient documentation

## 2024-02-17 DIAGNOSIS — F411 Generalized anxiety disorder: Secondary | ICD-10-CM | POA: Insufficient documentation

## 2024-02-17 DIAGNOSIS — F332 Major depressive disorder, recurrent severe without psychotic features: Secondary | ICD-10-CM | POA: Insufficient documentation

## 2024-02-17 NOTE — Progress Notes (Signed)
 NEUROPSYCHOLOGY TELEHEALTH FEEDBACK SESSION    PATIENT NAME: Kathy Gibson  MRN: [id number]  DOB: 27-Apr-1976  DOS: 02/17/2024    TIME IN/OUT: 10:03-11:01  CPT CODES: 1 x 08657 Neuropsychological testing evaluation services, each additional hour    TELEMEDICINE DOCUMENTATION:    Patient Location: [name]  Patient/family aware of provider location:  Yes  Patient/family consent for telemedicine:  Yes  Examination observed and performed by: Corrine Dimmer, Psy.D.    Subjective:  Pt. Presented for results of the neuropsychological evaluation conducted on 01/31/2024.    Objective: Pt. presented via Epic Video for feedback regarding their neuropsychological evaluation.  They were provided with all results and recommendations.          Assessment: Pt. did not exhibit any significant symptoms during the feedback session. Time was provided for questions regarding the results and recommendations.      Plan/procedure: Recommendations were provided as listed in the report. They were encouraged to contact me at any time should they have future questions, they have my contact information.     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      Corrine Dimmer, Psy.D.  Assistant Professor    [facility]

## 2024-02-18 LAB — QUEST MISC ORDER - FROZEN

## 2024-02-21 LAB — DEMENTIA, AUTOIMMUNE EVALUATION, SERUM
AMPA-R AB CBA: NEGATIVE
AMPHIPHYSIN AB, S: NEGATIVE
ANNA-2, S: NEGATIVE
ANTI-GLIAL NUCLEAR AB, TYPE 1: NEGATIVE
ANTI-NEURONAL NUCLEAR AB, TYPE 1: NEGATIVE
ANTI-NEURONAL NUCLEAR AB, TYPE 3: NEGATIVE
CASPR2-IgG CBA, S: NEGATIVE
CRMP-5-IGG, S: NEGATIVE
DPPX AB CBA, S: NEGATIVE
GABA-B-R AB CBA: NEGATIVE
GAD65 AB ASSAY: 0 nmol/L (ref <=0.02)
GFAP IFA, S: NEGATIVE
IGLON5 CBA, S: NEGATIVE
LGI1-IgG CBA, S: NEGATIVE
MGLUR1 AB IFA, S: NEGATIVE
NEUROCHONDRIN IFA, SERUM: NEGATIVE
NIF IFA, S: NEGATIVE
NMDA-R AB CBA: NEGATIVE
PDE10A AB IFA: NEGATIVE
PURKINJE CELL CYTOPLASMIC AB TYPE 2: NEGATIVE
PURKINJE CELL CYTOPLASMIC AB TYPE TR: NEGATIVE
TRIM46 AB IFA: NEGATIVE

## 2024-02-28 ENCOUNTER — Telehealth (HOSPITAL_BASED_OUTPATIENT_CLINIC_OR_DEPARTMENT_OTHER): Payer: Self-pay | Admitting: Internal Medicine

## 2024-02-28 NOTE — Telephone Encounter (Signed)
-----   Message from Nuangola S sent at 02/28/2024  1:01 PM EDT -----  Regarding: Sugar Levels  Contact: [phone]  Sugar is running high. 5am was 368. Took insulin .  Checked now and it is still 278 and not wanting to come down

## 2024-03-20 ENCOUNTER — Ambulatory Visit: Payer: Self-pay | Attending: Internal Medicine | Admitting: Internal Medicine

## 2024-03-20 ENCOUNTER — Other Ambulatory Visit: Payer: Self-pay

## 2024-03-20 ENCOUNTER — Encounter (HOSPITAL_BASED_OUTPATIENT_CLINIC_OR_DEPARTMENT_OTHER): Payer: Self-pay | Admitting: Internal Medicine

## 2024-03-20 VITALS — BP 137/86 | HR 90 | Temp 98.4°F | Ht 71.0 in | Wt 258.0 lb

## 2024-03-20 DIAGNOSIS — E7849 Other hyperlipidemia: Secondary | ICD-10-CM | POA: Insufficient documentation

## 2024-03-20 DIAGNOSIS — E039 Hypothyroidism, unspecified: Secondary | ICD-10-CM | POA: Insufficient documentation

## 2024-03-20 DIAGNOSIS — E118 Type 2 diabetes mellitus with unspecified complications: Secondary | ICD-10-CM | POA: Insufficient documentation

## 2024-03-20 DIAGNOSIS — Z794 Long term (current) use of insulin: Secondary | ICD-10-CM | POA: Insufficient documentation

## 2024-03-20 LAB — COMPREHENSIVE METABOLIC PNL, FASTING
ALBUMIN: 4.1 g/dL (ref 3.5–5.2)
ALKALINE PHOSPHATASE: 171 U/L — ABNORMAL HIGH (ref 35–129)
ALT (SGPT): 17 U/L (ref 0–33)
ANION GAP: 13 mmol/L (ref 7–18)
AST (SGOT): 18 U/L (ref 0–32)
BILIRUBIN TOTAL: 0.2 mg/dL (ref 0.2–1.2)
BUN: 10 mg/dL (ref 6–20)
CALCIUM: 9.5 mg/dL (ref 8.3–10.7)
CHLORIDE: 105 mmol/L (ref 96–106)
CO2 TOTAL: 24 mmol/L (ref 22–30)
CREATININE: 0.77 mg/dL (ref 0.50–0.90)
ESTIMATED GFR: >90 mL/min/{1.73_m2} (ref >90)
GLUCOSE: 130 mg/dL — ABNORMAL HIGH (ref 74–109)
POTASSIUM: 3.7 mmol/L (ref 3.2–5.0)
PROTEIN TOTAL: 7 g/dL (ref 6.4–8.3)
SODIUM: 142 mmol/L (ref 133–144)

## 2024-03-20 LAB — LIPID PANEL
CHOLESTEROL: 138 mg/dL (ref 0–200)
HDL CHOL: 61 mg/dL
LDL CALC: 56 mg/dL (ref 0–99)
TRIGLYCERIDES: 116 mg/dL (ref 0–150)
VLDL CALC: 21 mg/dL (ref 0–30)

## 2024-03-20 LAB — THYROID STIMULATING HORMONE WITH FREE T4 REFLEX: TSH: 0.14 u[IU]/mL — ABNORMAL LOW (ref 0.270–4.200)

## 2024-03-20 LAB — THYROXINE, FREE (FREE T4): THYROXINE (T4), FREE: 1.65 ng/dL (ref 0.93–1.70)

## 2024-03-20 LAB — CREATINE KINASE (CK), TOTAL, SERUM OR PLASMA: CREATINE KINASE: 75 U/L (ref <=170)

## 2024-03-20 MED ORDER — BD ULTRA-FINE NANO PEN NEEDLE 32 GAUGE X 5/32"
3 refills | Status: DC
Start: 2024-03-20 — End: 2024-06-27

## 2024-03-20 MED ORDER — INSULIN ASPART (U-100) 100 UNIT/ML (3 ML) SUBCUTANEOUS PEN
PEN_INJECTOR | SUBCUTANEOUS | 2 refills | Status: AC
Start: 2024-03-20 — End: ?

## 2024-03-20 MED ORDER — MOUNJARO 2.5 MG/0.5 ML SUBCUTANEOUS PEN INJECTOR
2.5000 mg | PEN_INJECTOR | SUBCUTANEOUS | 0 refills | Status: DC
Start: 2024-03-20 — End: 2024-08-21

## 2024-03-20 MED ORDER — MOUNJARO 5 MG/0.5 ML SUBCUTANEOUS PEN INJECTOR
5.0000 mg | PEN_INJECTOR | SUBCUTANEOUS | 2 refills | Status: DC
Start: 2024-03-20 — End: 2024-08-13

## 2024-03-20 MED ORDER — LANTUS SOLOSTAR U-100 INSULIN 100 UNIT/ML (3 ML) SUBCUTANEOUS PEN
20.0000 [IU] | PEN_INJECTOR | Freq: Every evening | SUBCUTANEOUS | 3 refills | Status: DC
Start: 2024-03-20 — End: 2024-07-04

## 2024-03-20 NOTE — Progress Notes (Signed)
 OUTPATIENT PROGRESS NOTE  CC: Diabetes Mellitus, type 2  Subjective:   The history is provided by the patient and the spouse.   Diabetes  She presents for her follow-up diabetic visit. She has type 2 diabetes mellitus. Her disease course has been stable. Hypoglycemia symptoms include confusion (when bs drops), speech difficulty (when bs drops), sweats (when bs drops) and tremors. Associated symptoms include blurred vision, fatigue and foot paresthesias. Pertinent negatives for diabetes include no chest pain, no foot ulcerations and no weight loss (gained 8lb). (Nocturia 2x ) There are no hypoglycemic complications. Pertinent negatives for hypoglycemia complications include no blackouts. Diabetic complications include heart disease, peripheral neuropathy and retinopathy. Pertinent negatives for diabetic complications include no autonomic neuropathy, CVA, nephropathy or PVD. Current diabetic treatment includes oral agent (dual therapy). She is compliant with treatment most of the time. Her weight is increasing steadily. She is following a generally healthy diet. Meal planning includes avoidance of concentrated sweets. She participates in exercise intermittently. Her home blood glucose trend is fluctuating minimally. An ACE inhibitor/angiotensin II receptor blocker is not being taken. She sees a podiatrist.Eye exam is current.      Kathy Gibson, July 20, 1976,  is a 48 y.o. female who presents to the endocrine clinic today for follow-up of Type 2 Diabetes Mellitus. The patient's diabetes is complicated by retinopathy, CAD, peripheral neuropathy, arthropathy, Charcot disease, hypoglycemia, and hypoglycemia unawareness. HgA1C 6.9%.     Has diabetes  hyperlipidemia and hypothyroidism s/p rai rx 2003   Diabetes diagnosed when had gestational in 1998 and then continued with diabetes   started on insulin  2020 was on lantus 16u qam but has been off due to low BS  but then restarted 10u qhs 2 weeks ago  and novolog  s/s has used it  more 4-10u 3xqd and on trulicity  3mg  q week and jardiance  25 mg off  metformin due to  diarhea    has 2 living children 1 son 49 and healthy and daughter born at 68 weeks from trauma to abdomen and has learning disability  since last visit 15m ago  ago  no hospitalization no surgery knocked off toenail pinky rt foot    Last  foot surgery for charcot rt foot nov 2024   Has been seing a neurologist and saw neuropsychological exam 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 Negative for weight loss (gained 8lb).   Eyes:  Positive for blurred vision.   Cardiovascular:  Negative for chest pain.   Neurological:  Positive for tremors and speech difficulty (when bs drops).   Psychiatric/Behavioral:  Positive for confusion (when bs drops).             Labs:  Reviewed    Aug 27/2024 hba1c 6.5 TSH 0.629  02/13/2024 urine microalbumin normal   Problem List / Past Medical History:     Patient Active Problem List   Diagnosis    Leukocytosis    Other  specified disorders of white blood cells    Lupus    Essential hypertension    History of angina    Raynaud disease    Other hyperlipidemia    Asthma    Fibromyalgia    Migraines    Insomnia    Fatty liver    Gastroparesis    Controlled type 2 diabetes mellitus, with long-term current use of insulin     Pancreatitis    Hypothyroidism    Anxiety and depression    IBS (irritable bowel syndrome)    Easy bruising    Postablative hypothyroidism    Vitamin D  deficiency        Medications:     Current Outpatient Medications   Medication Sig    atorvastatin  (LIPITOR) 40 mg Oral Tablet Take 1  Tablet (40 mg total) by mouth Every evening for 90 days    BD INSULIN  PEN NEEDLE UF 31 gauge x 5/16" Needle     BD UF NANO PEN NEEDLE 32 gauge x 5/32" Needle Use as directed to inject insulin  4xqd    bethanechol chloride (URECHOLINE) 5 mg Oral Tablet Take 1 Tablet (5 mg total) by mouth Three times a day    Blood-Glucose Sensor (DEXCOM G7 SENSOR) Does not apply Device 1 Device Every 10 days    clonazePAM (KLONOPIN) 1 mg Oral Tablet Take 1 Tablet (1 mg total) by mouth Three times a day    cyanocobalamin (VITAMIN B12) 1,000 mcg/mL Injection Solution Inject 1 mL (1,000 mcg total) under the skin Every 30 days    desvenlafaxine succinate (PRISTIQ) 50 mg Oral Tablet Sustained Release 24 hr Take 1 Tablet (50 mg total) by mouth Daily    dicyclomine (BENTYL) 10 mg Oral Capsule Take 1 Capsule (10 mg total) by mouth Twice daily    empagliflozin  (JARDIANCE ) 25 mg Oral Tablet Take 1 Tablet (25 mg total) by mouth Every morning    ergocalciferol , vitamin D2, (DRISDOL) 1,250 mcg (50,000 unit) Oral Capsule Take 1 Capsule (50,000 Units total) by mouth Every 7 days    famotidine (PEPCID) 40 mg Oral Tablet Take 1 Tablet (40 mg total) by mouth Twice daily    gabapentin (NEURONTIN) 300 mg Oral Capsule Take 1 Capsule (300 mg total) by mouth Five times a day    glucagon  (BAQSIMI ) 3 mg per spray nasal spray Administer 1 Spray into affected nostril(s) Once, as needed for Blood sugar below (70 with altered mental status, may repaet in 15 min if no response,) for up to 2 doses If no response after the 1st dose, dial 911.    hydrOXYchloroQUINE (PLAQUENIL) 200 mg Oral Tablet Take 1 Tablet (200 mg total) by mouth Twice daily    insulin  aspart U-100 (NOVOLOG  FLEXPEN U-100 INSULIN ) 100 unit/mL (3 mL) Subcutaneous Insulin  Pen Use as directed per sliding scale, max dose 45u daily    isosorbide mononitrate (IMDUR) 30 mg Oral Tablet Sustained Release 24 hr Take 1 Tablet (30 mg total) by mouth Every morning    Lancets (MICROLET LANCET) Misc 1 Lancet Four  times a day    levothyroxine  (SYNTHROID) 175 mcg Oral Tablet Take 1 Tablet (175 mcg total) by mouth Every morning    mirtazapine (REMERON) 30 mg Oral Tablet Take 1 Tablet (30 mg total) by mouth Every night    nitroGLYCERIN (NITROSTAT) 0.4 mg Sublingual Tablet, Sublingual     omeprazole (PRILOSEC) 40 mg Oral Capsule, Delayed Release(E.C.) Take 1 Capsule (40 mg total) by mouth Twice daily    ondansetron (ZOFRAN)  8 mg Oral Tablet Take 1 Tablet (8 mg total) by mouth Every 8 hours as needed for Nausea/Vomiting    REXULTI 1 mg Oral Tablet Take 1 Tablet (1 mg total) by mouth Daily    tirzepatide (MOUNJARO) 2.5 mg/0.5 mL Subcutaneous Pen Injector Inject 0.5 mL (2.5 mg total) under the skin Every 7 days    tirzepatide (MOUNJARO) 5 mg/0.5 mL Subcutaneous Pen Injector Inject 0.5 mL (5 mg total) under the skin Every 7 days        Allergies:     Allergies   Allergen Reactions    Cefdinir Anaphylaxis    Doxycycline Anaphylaxis    Meperidine Rash    Morphine Anaphylaxis    Nsaids (Non-Steroidal Anti-Inflammatory Drug) Hives/ Urticaria    Tape [Adhesive]     Tomatoes [Tomato]        Social History:     Social History     Tobacco Use    Smoking status: Former     Current packs/day: 0.00     Average packs/day: 0.5 packs/day for 12.0 years (6.0 ttl pk-yrs)     Types: Cigarettes     Start date: 2005     Quit date: 2017     Years since quitting: 8.4    Smokeless tobacco: Never   Vaping Use    Vaping status: Never Used   Substance Use Topics    Alcohol use: Yes     Comment: occationally    Drug use: Yes     Types: Marijuana     Comment: legal pen to help with teeth pain       Objective:   BP 137/86   Pulse 90   Temp 36.9 C (98.4 F)   Ht 1.803 m (5\' 11" )   Wt 117 kg (258 lb)   SpO2 95%   BMI 35.98 kg/m       Physical Exam:  Physical Exam  Constitutional:       Appearance: Normal appearance. She is obese.   HENT:      Head: Normocephalic.   Neck:      Thyroid : No thyroid  mass, thyromegaly (firm upper normal) or thyroid  tenderness.    Cardiovascular:      Rate and Rhythm: Normal rate and regular rhythm.      Heart sounds: Normal heart sounds. No murmur heard.  Pulmonary:      Effort: Pulmonary effort is normal.      Breath sounds: Normal breath sounds.   Lymphadenopathy:      Cervical: No cervical adenopathy.   Neurological:      General: No focal deficit present.      Mental Status: She is alert.      Cranial Nerves: No cranial nerve deficit.   Psychiatric:         Attention and Perception: Attention normal.         Mood and Affect: Mood normal.         Speech: Speech normal.         Behavior: Behavior normal. Behavior is cooperative.         Cognition and Memory: Cognition and memory normal.         Judgment: Judgment normal.                    Assessment & Plan:   (E11.8,  Z79.4) Controlled type 2 diabetes mellitus with complication, with long-term current use of insulin  (CMS HCC)  (primary encounter diagnosis)  Plan: POCT Blood  Glucose/Fingerstick (AMB), POCT HGB         A1C, 95251 - INTERPRETATION/REPORT CONTINUOUS         GLUCOSE MONITORING VIA SUBCUTANEOUS SENSOR (AMB        ONLY), COMPREHENSIVE METABOLIC PNL, FASTING,         LIPID PANEL, CREATINE KINASE (CK), TOTAL, SERUM        OR PLASMA, THYROID  STIMULATING HORMONE WITH         FREE T4 REFLEX    (E03.9) Hypothyroidism, unspecified type  Plan: COMPREHENSIVE METABOLIC PNL, FASTING, LIPID         PANEL, CREATINE KINASE (CK), TOTAL, SERUM OR         PLASMA, THYROID  STIMULATING HORMONE WITH FREE         T4 REFLEX    (E78.49) Other hyperlipidemia  Plan: COMPREHENSIVE METABOLIC PNL, FASTING, LIPID         PANEL, CREATINE KINASE (CK), TOTAL, SERUM OR         PLASMA, THYROID  STIMULATING HORMONE WITH FREE         T4 REFLEX       Orders Placed This Encounter    95251 - INTERPRETATION/REPORT CONTINUOUS GLUCOSE MONITORING VIA SUBCUTANEOUS SENSOR (AMB ONLY)    COMPREHENSIVE METABOLIC PNL, FASTING    LIPID PANEL    CREATINE KINASE (CK), TOTAL, SERUM OR PLASMA    THYROID  STIMULATING HORMONE WITH  FREE T4 REFLEX    POCT Blood Glucose/Fingerstick (AMB)    POCT HGB A1C    tirzepatide (MOUNJARO) 2.5 mg/0.5 mL Subcutaneous Pen Injector    tirzepatide (MOUNJARO) 5 mg/0.5 mL Subcutaneous Pen Injector     Continue Novolog  4-10u if BS high >200 during day day do 2-3xqd   BS is higher and is gaining weight   Switch to mounjaro start with 2.5mg  q week increase after 4 doses to 5mg  q week   Can call after that to increase more   Get labs today and call   Try to use dexcom all the time to help manage hypoglycemia   Mingo Amas MD

## 2024-03-20 NOTE — Nursing Note (Signed)
 03/20/24 0900   Required: Location Test Performed At:   [facility] - [address], [place], [place] [address]   A1C   Time Performed 0921   A1C 6.9   A1C Lot # [id number]   Expiration Date 02/21/26   Internal Control Valid yes   Lancet used on  Finger   Initials sw   Glucose  (Fingerstick)   Time Performed 0921   Glucose (Ref Range 74-106 mg/dl) 295   Lot # [id number]   Expiration Date 06/07/25   Internal Control Valid Yes   Initials sw

## 2024-03-20 NOTE — Procedures (Signed)
 [facility]  [address]  [place] [address]  Operated by [facility]  Procedure Note    Name: Kalila Adkison MRN:  [id number]   Date: 03/20/2024 DOB:  April 09, 1976 (48 y.o.)         95251 - INTERPRETATION/REPORT CONTINUOUS GLUCOSE MONITORING VIA SUBCUTANEOUS SENSOR (AMB ONLY)    Performed by: Mingo Amas, MD  Authorized by: Mingo Amas, MD    cgm Sensor  Type dexcom  Average days of CGM 29  Average blood glucose 177  Standard Deviation 41    55 % in range (70-180 mg/dl)  47% high (829 - 562 mg/dl)  4% very high (>130 mg/dl)  0% low (70 -54 mg/dl)  <1 % very low (<86 mg/dl)   BS higher day time   Mingo Amas, MD

## 2024-03-20 NOTE — Patient Instructions (Signed)
 patient advised to be compliant with medications and monitoring blood sugar  advised to have a yearly eye exam   advised about foot care and see a podiatrist if necessary  explained short term and long term complications of uncontrolled diabetes   discussed symptoms and signs of hypoglycemia and hyperglycemia and what to do in each situation   discussed targets for lipids  discussed target for BP  discussed targets for blood sugars and HBA1C taking consideration age ,duration of diabetes ,risk of hypoglycemia cardiac and neurological factors

## 2024-04-30 ENCOUNTER — Telehealth (HOSPITAL_BASED_OUTPATIENT_CLINIC_OR_DEPARTMENT_OTHER): Payer: Self-pay | Admitting: Internal Medicine

## 2024-04-30 NOTE — Telephone Encounter (Signed)
-----   Message from [place] S sent at 04/30/2024 11:23 AM EDT -----  Regarding: Sugar Extremely High  Contact: [phone]  Sugar is running in the 300's after taking insulin . Please call.

## 2024-06-12 ENCOUNTER — Encounter (INDEPENDENT_AMBULATORY_CARE_PROVIDER_SITE_OTHER): Payer: Self-pay

## 2024-06-12 ENCOUNTER — Ambulatory Visit (INDEPENDENT_AMBULATORY_CARE_PROVIDER_SITE_OTHER): Payer: Self-pay

## 2024-06-26 ENCOUNTER — Ambulatory Visit (HOSPITAL_BASED_OUTPATIENT_CLINIC_OR_DEPARTMENT_OTHER): Payer: Self-pay | Admitting: Internal Medicine

## 2024-06-27 ENCOUNTER — Other Ambulatory Visit (HOSPITAL_BASED_OUTPATIENT_CLINIC_OR_DEPARTMENT_OTHER): Payer: Self-pay | Admitting: Internal Medicine

## 2024-06-27 MED ORDER — BD ULTRA-FINE NANO PEN NEEDLE 32 GAUGE X 5/32"
3 refills | Status: AC
Start: 2024-06-27 — End: ?

## 2024-07-04 ENCOUNTER — Other Ambulatory Visit (HOSPITAL_BASED_OUTPATIENT_CLINIC_OR_DEPARTMENT_OTHER): Payer: Self-pay | Admitting: Internal Medicine

## 2024-07-04 MED ORDER — LANTUS SOLOSTAR U-100 INSULIN 100 UNIT/ML (3 ML) SUBCUTANEOUS PEN: 20.0000 [IU] | PEN_INJECTOR | Freq: Every evening | SUBCUTANEOUS | 3 refills | Status: DC

## 2024-07-04 MED ORDER — BASAGLAR KWIKPEN U-100 INSULIN 100 UNIT/ML (3 ML) SUBCUTANEOUS: 20.0000 [IU] | PEN_INJECTOR | Freq: Every day | SUBCUTANEOUS | 3 refills | Status: AC

## 2024-08-13 ENCOUNTER — Other Ambulatory Visit (HOSPITAL_BASED_OUTPATIENT_CLINIC_OR_DEPARTMENT_OTHER): Payer: Self-pay | Admitting: Internal Medicine

## 2024-08-13 MED ORDER — MOUNJARO 5 MG/0.5 ML SUBCUTANEOUS PEN INJECTOR
5.0000 mg | PEN_INJECTOR | SUBCUTANEOUS | 2 refills | Status: DC
Start: 2024-08-13 — End: 2024-08-21

## 2024-08-20 ENCOUNTER — Other Ambulatory Visit (HOSPITAL_BASED_OUTPATIENT_CLINIC_OR_DEPARTMENT_OTHER): Payer: Self-pay | Admitting: Internal Medicine

## 2024-08-20 MED ORDER — EMPAGLIFLOZIN 25 MG TABLET
25.0000 mg | ORAL_TABLET | Freq: Every morning | ORAL | 5 refills | Status: DC
Start: 2024-08-20 — End: 2024-08-21

## 2024-08-20 MED ORDER — DEXCOM G7 SENSOR DEVICE: 1.0000 | 5 refills | Status: DC

## 2024-08-21 ENCOUNTER — Other Ambulatory Visit (HOSPITAL_BASED_OUTPATIENT_CLINIC_OR_DEPARTMENT_OTHER): Payer: Self-pay | Admitting: Internal Medicine

## 2024-08-21 MED ORDER — EMPAGLIFLOZIN 25 MG TABLET
25.0000 mg | ORAL_TABLET | Freq: Every morning | ORAL | 5 refills | Status: AC
Start: 2024-08-21 — End: 2024-09-20

## 2024-08-21 MED ORDER — MOUNJARO 5 MG/0.5 ML SUBCUTANEOUS PEN INJECTOR
5.0000 mg | PEN_INJECTOR | SUBCUTANEOUS | 3 refills | Status: AC
Start: 2024-08-21 — End: 2024-09-20

## 2024-08-22 ENCOUNTER — Ambulatory Visit (HOSPITAL_BASED_OUTPATIENT_CLINIC_OR_DEPARTMENT_OTHER): Payer: Self-pay | Admitting: Internal Medicine

## 2024-09-25 ENCOUNTER — Ambulatory Visit (HOSPITAL_BASED_OUTPATIENT_CLINIC_OR_DEPARTMENT_OTHER): Payer: Self-pay | Admitting: Internal Medicine

## 2024-10-06 ENCOUNTER — Other Ambulatory Visit (HOSPITAL_BASED_OUTPATIENT_CLINIC_OR_DEPARTMENT_OTHER): Payer: Self-pay | Admitting: Internal Medicine

## 2024-10-26 ENCOUNTER — Ambulatory Visit: Attending: Internal Medicine | Admitting: Internal Medicine

## 2024-10-26 ENCOUNTER — Encounter (HOSPITAL_BASED_OUTPATIENT_CLINIC_OR_DEPARTMENT_OTHER): Payer: Self-pay | Admitting: Internal Medicine

## 2024-10-26 ENCOUNTER — Other Ambulatory Visit: Payer: Self-pay

## 2024-10-26 VITALS — BP 148/90 | HR 105 | Temp 97.2°F | Ht 71.0 in | Wt 270.0 lb

## 2024-10-26 DIAGNOSIS — E1165 Type 2 diabetes mellitus with hyperglycemia: Secondary | ICD-10-CM | POA: Insufficient documentation

## 2024-10-26 DIAGNOSIS — E119 Type 2 diabetes mellitus without complications: Secondary | ICD-10-CM | POA: Insufficient documentation

## 2024-10-26 DIAGNOSIS — E7849 Other hyperlipidemia: Secondary | ICD-10-CM | POA: Insufficient documentation

## 2024-10-26 DIAGNOSIS — Z794 Long term (current) use of insulin: Secondary | ICD-10-CM | POA: Insufficient documentation

## 2024-10-26 DIAGNOSIS — E890 Postprocedural hypothyroidism: Secondary | ICD-10-CM | POA: Insufficient documentation

## 2024-10-26 DIAGNOSIS — E039 Hypothyroidism, unspecified: Secondary | ICD-10-CM

## 2024-10-26 MED ORDER — MOUNJARO 7.5 MG/0.5 ML SUBCUTANEOUS PEN INJECTOR: 7.5000 mg | PEN_INJECTOR | SUBCUTANEOUS | 1 refills | Status: DC

## 2024-10-26 MED ORDER — DEXCOM G7 SENSOR DEVICE: 1.0000 | 5 refills | Status: AC

## 2024-10-26 MED ORDER — LEVOTHYROXINE 150 MCG TABLET: 150.0000 ug | ORAL_TABLET | Freq: Every morning | ORAL | 2 refills | Status: AC

## 2024-10-26 NOTE — Nursing Note (Signed)
 10/26/24 1000   Required: Location Test Performed At:   [facility] - [address], [place], [place] [address]   Glucose  (Fingerstick)   Time Performed 1019   Glucose (Ref Range 74-106 mg/dl) 792   Lot # [id number]   Expiration Date 10/30/25   Internal Control Valid Yes   Initials sw

## 2024-10-26 NOTE — Patient Instructions (Signed)
 patient advised to be compliant with medications and monitoring blood sugar  advised to have a yearly eye exam   advised about foot care and see a podiatrist if necessary  explained short term and long term complications of uncontrolled diabetes   discussed symptoms and signs of hypoglycemia and hyperglycemia and what to do in each situation   discussed targets for lipids  discussed target for BP  discussed targets for blood sugars and HBA1C taking consideration age ,duration of diabetes ,risk of hypoglycemia cardiac and neurological factors

## 2024-10-26 NOTE — Procedures (Signed)
 [facility]  [address]  [place] [address]  Operated by [facility]  Procedure Note    Name: Kathy Gibson MRN:  [id number]   Date: 10/26/2024 DOB:  04-08-1976 (48 y.o.)         95251 - INTERPRETATION/REPORT CONTINUOUS GLUCOSE MONITORING VIA SUBCUTANEOUS SENSOR (AMB ONLY)    Performed by: Gifford Night, MD  Authorized by: Gifford Night, MD    cgm Sensor  Type dexcom  Average days of CGM 78  Average blood glucose 164  Standard Deviation 38    67 % in range (70-180 mg/dl)  68% high (819 - 749 mg/dl)  2% very high (>749 mg/dl)  0% low (70 -54 mg/dl)  <1 % very low (<45 mg/dl)   BS higher day time   Night Gifford, MD

## 2024-10-26 NOTE — Progress Notes (Signed)
 OUTPATIENT PROGRESS NOTE  CC: Diabetes Mellitus, type 2  Subjective:   The history is provided by the patient and the spouse.   Diabetes  She presents for her follow-up diabetic visit. She has type 2 diabetes mellitus. Her disease course has been stable. Hypoglycemia symptoms include confusion (when bs drops), speech difficulty (when bs drops), sweats (when bs drops) and tremors. Associated symptoms include blurred vision, fatigue and foot paresthesias. Pertinent negatives for diabetes include no chest pain, no foot ulcerations and no weight loss (gained 12lb). (Nocturia 1-2x ) There are no hypoglycemic complications. Diabetic complications include heart disease, peripheral neuropathy and retinopathy. Pertinent negatives for diabetic complications include no autonomic neuropathy, CVA, nephropathy or PVD. Current diabetic treatment includes oral agent (dual therapy) and insulin  injections. She is compliant with treatment most of the time. Her weight is increasing steadily. She is following a generally healthy diet. Meal planning includes avoidance of concentrated sweets. She participates in exercise intermittently. Her home blood glucose trend is fluctuating minimally. An ACE inhibitor/angiotensin II receptor blocker is not being taken. She sees a podiatrist.Eye exam is current.      Ms. Parsley, December 17, 1975,  is a 49 y.o. female who presents to the endocrine clinic today for follow-up of Type 2 Diabetes Mellitus. The patient's diabetes is complicated by retinopathy, CAD, peripheral neuropathy, arthropathy, Charcot disease, hypoglycemia, and hypoglycemia unawareness. HgA1C 7.3 %.     Has diabetes  hyperlipidemia and hypothyroidism s/p rai rx 2003   Diabetes diagnosed when had gestational in 1998 and then continued with diabetes   started on insulin  2020 was on lantus  16u qam but has been off due to low BS  but then restarted  may 2025  taking basaglar  20u qam and novolog  s/s has used it more 4-10u 3xqd and on mounjaro   5mg  q week  and jardiance  25 mg off  metformin due to  diarhea    has 2 living children 1 son 43 and healthy and daughter born at 45 weeks from trauma to abdomen and has learning disability  since last visit 94m ago  ago  no hospitalization had screws from rt foot sept 2025 found out is anemic on iron has no period had hysterectomy at 37   Taking synthroid 175 mcg dose not adjusted    Last  foot surgery for charcot rt foot sept  2025   Has been seing a neurologist and saw neuropsychological exam 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 Negative for weight loss (gained 12lb).   Eyes:  Positive for blurred vision.   Cardiovascular:  Negative for chest pain.   Neurological:  Positive for tremors and speech difficulty (when bs drops).   Psychiatric/Behavioral:  Positive for confusion (when bs drops).             Labs:  Reviewed    Aug 27/2024 hba1c 6.5 TSH 0.629  02/13/2024 urine microalbumin  normal   03/20/2024 ft4 1.65 TSH 0.140 ck 75 LDL 56 trig 116 cr 0.77  ca 9.5  09/17/2024 hb 9.9 cr0.82 gluc 141 ca 9.3ldl 88 trig 99 hba1c 7.3 TSH 0.366 vitd 26 mg 2.1   Problem List / Past Medical History:     Patient Active Problem List   Diagnosis    Leukocytosis    Other specified disorders of white blood cells    Lupus    Essential hypertension    History of angina    Raynaud disease    Other hyperlipidemia    Asthma    Fibromyalgia    Migraines    Insomnia    Fatty liver    Gastroparesis    Controlled type 2 diabetes mellitus, with long-term current use of insulin     Pancreatitis    Hypothyroidism     Anxiety and depression    IBS (irritable bowel syndrome)    Easy bruising    Postablative hypothyroidism    Vitamin D  deficiency    Poorly controlled type 2 diabetes mellitus        Medications:     Current Outpatient Medications   Medication Sig    atorvastatin  (LIPITOR) 40 mg Oral Tablet TAKE 1 TABLET by mouth EVERY EVENING    BD UF NANO PEN NEEDLE 32 gauge x 5/32 Needle Use as directed to inject insulin  4xqd    bethanechol chloride (URECHOLINE) 5 mg Oral Tablet Take 1 Tablet (5 mg total) by mouth Three times a day    Blood-Glucose Sensor (DEXCOM G7 SENSOR) Does not apply Device 1 Device Every 10 days    clonazePAM (KLONOPIN) 1 mg Oral Tablet Take 1 Tablet (1 mg total) by mouth Three times a day    desvenlafaxine succinate (PRISTIQ) 50 mg Oral Tablet Sustained Release 24 hr Take 1 Tablet (50 mg total) by mouth Daily    dicyclomine (BENTYL) 10 mg Oral Capsule Take 1 Capsule (10 mg total) by mouth Twice daily    ergocalciferol , vitamin D2, (DRISDOL) 1,250 mcg (50,000 unit) Oral Capsule Take 1 Capsule (50,000 Units total) by mouth Every 7 days    famotidine (PEPCID) 40 mg Oral Tablet Take 1 Tablet (40 mg total) by mouth Twice daily    gabapentin (NEURONTIN) 300 mg Oral Capsule Take 1 Capsule (300 mg total) by mouth Five times a day    glucagon  (BAQSIMI ) 3 mg per spray nasal spray Administer 1 Spray into affected nostril(s) Once, as needed for Blood sugar below (70 with altered mental status, may repaet in 15 min if no response,) for up to 2 doses If no response after the 1st dose, dial 911.    insulin  aspart U-100 (NOVOLOG  FLEXPEN U-100 INSULIN ) 100 unit/mL (3 mL) Subcutaneous Insulin  Pen Use as directed per sliding scale, max dose 45u daily    insulin  glargine (BASAGLAR  KWIKPEN U-100 INSULIN ) 100 unit/mL Subcutaneous Insulin  Pen Inject 20 Units under the skin Daily    isosorbide mononitrate (IMDUR) 30 mg Oral Tablet Sustained Release 24 hr Take 1 Tablet (30 mg total) by mouth Every morning    Lancets (MICROLET  LANCET) Misc 1 Lancet Four times a day    levothyroxine  (SYNTHROID) 150 mcg Oral Tablet Take 1 Tablet (150 mcg total) by mouth Every morning    mirtazapine (REMERON) 30 mg Oral Tablet Take 1 Tablet (30 mg total) by mouth Every night    nitroGLYCERIN (NITROSTAT) 0.4 mg Sublingual Tablet, Sublingual     omeprazole (PRILOSEC) 40 mg Oral Capsule, Delayed  Release(E.C.) Take 1 Capsule (40 mg total) by mouth Twice daily    ondansetron (ZOFRAN) 8 mg Oral Tablet Take 1 Tablet (8 mg total) by mouth Every 8 hours as needed for Nausea/Vomiting    REXULTI 1 mg Oral Tablet Take 1 Tablet (1 mg total) by mouth Daily    tirzepatide  (MOUNJARO ) 7.5 mg/0.5 mL Subcutaneous Pen Injector Inject 0.5 mL (7.5 mg total) under the skin Every 7 days        Allergies:     Allergies   Allergen Reactions    Cefdinir Anaphylaxis    Doxycycline Anaphylaxis    Meperidine Rash    Morphine Anaphylaxis    Nsaids (Non-Steroidal Anti-Inflammatory Drug) Hives/ Urticaria    Tape [Adhesive]     Tomatoes [Tomato]        Social History:     Social History     Tobacco Use    Smoking status: Former     Current packs/day: 0.00     Average packs/day: 0.5 packs/day for 12.0 years (6.0 ttl pk-yrs)     Types: Cigarettes     Start date: 2005     Quit date: 2017     Years since quitting: 9.0    Smokeless tobacco: Never   Vaping Use    Vaping status: Every Day    Substances: THC   Substance Use Topics    Alcohol use: Yes     Comment: occationally    Drug use: Yes     Types: Marijuana     Comment: legal pen to help with teeth pain       Objective:   BP (!) 148/90   Pulse (!) 105   Temp 36.2 C (97.2 F)   Ht 1.803 m (5' 11)   Wt 122 kg (270 lb)   SpO2 95%   BMI 37.66 kg/m       Physical Exam:  Physical Exam  Constitutional:       Appearance: Normal appearance. She is obese.   HENT:      Head: Normocephalic.   Neck:      Thyroid : No thyroid  mass, thyromegaly (firm upper normal) or thyroid  tenderness.   Cardiovascular:      Rate and Rhythm: Normal rate and regular  rhythm.      Heart sounds: Normal heart sounds. No murmur heard.  Pulmonary:      Effort: Pulmonary effort is normal.      Breath sounds: Normal breath sounds.   Lymphadenopathy:      Cervical: No cervical adenopathy.   Neurological:      General: No focal deficit present.      Mental Status: She is alert and oriented to person, place, and time.      Cranial Nerves: No cranial nerve deficit.      Motor: Tremor present.   Psychiatric:         Attention and Perception: Attention normal.         Mood and Affect: Mood normal.         Speech: Speech normal.         Behavior: Behavior normal. Behavior is cooperative.         Cognition and Memory: Cognition and memory normal.         Judgment: Judgment normal.                    Assessment & Plan:   (E11.65) Poorly controlled type 2 diabetes mellitus  (primary encounter diagnosis)  Plan: POCT Blood Glucose/Fingerstick (AMB), 95251 -         INTERPRETATION/REPORT CONTINUOUS GLUCOSE         MONITORING VIA SUBCUTANEOUS SENSOR (AMB ONLY),         COMPREHENSIVE METABOLIC PNL, FASTING, HGA1C         (HEMOGLOBIN A1C WITH EST AVG GLUCOSE),         MICROALBUMIN/CREATININE RATIO, URINE, RANDOM,         LIPID PANEL, CREATINE KINASE (CK), TOTAL, SERUM        OR PLASMA, THYROID  STIMULATING HORMONE WITH         FREE T4 REFLEX    (E11.9,  Z79.4) Controlled type 2 diabetes mellitus, with long-term current use of insulin   Plan: COMPREHENSIVE METABOLIC PNL, FASTING, HGA1C         (HEMOGLOBIN A1C WITH EST AVG GLUCOSE),         MICROALBUMIN/CREATININE RATIO, URINE, RANDOM,         LIPID PANEL, CREATINE KINASE (CK), TOTAL, SERUM        OR PLASMA, THYROID  STIMULATING HORMONE WITH         FREE T4 REFLEX    (E78.49) Other hyperlipidemia  Plan: COMPREHENSIVE METABOLIC PNL, FASTING, HGA1C         (HEMOGLOBIN A1C WITH EST AVG GLUCOSE),         MICROALBUMIN/CREATININE RATIO, URINE, RANDOM,         LIPID PANEL, CREATINE KINASE (CK), TOTAL, SERUM        OR PLASMA, THYROID  STIMULATING HORMONE WITH          FREE T4 REFLEX    (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  Plan: COMPREHENSIVE METABOLIC PNL, FASTING, HGA1C         (HEMOGLOBIN A1C WITH EST AVG GLUCOSE),         MICROALBUMIN/CREATININE RATIO, URINE, RANDOM,         LIPID PANEL, CREATINE KINASE (CK), TOTAL, SERUM        OR PLASMA, THYROID  STIMULATING HORMONE WITH         FREE T4 REFLEX       Orders Placed This Encounter    95251 - INTERPRETATION/REPORT CONTINUOUS GLUCOSE MONITORING VIA SUBCUTANEOUS SENSOR (AMB ONLY)    COMPREHENSIVE METABOLIC PNL, FASTING    HGA1C (HEMOGLOBIN A1C WITH EST AVG GLUCOSE)    MICROALBUMIN/CREATININE RATIO, URINE, RANDOM    LIPID PANEL    CREATINE KINASE (CK), TOTAL, SERUM OR PLASMA    THYROID  STIMULATING HORMONE WITH FREE T4 REFLEX    POCT Blood Glucose/Fingerstick (AMB)    levothyroxine  (SYNTHROID) 150 mcg Oral Tablet    tirzepatide  (MOUNJARO ) 7.5 mg/0.5 mL Subcutaneous Pen Injector    Blood-Glucose Sensor (DEXCOM G7 SENSOR) Does not apply Device     Labs reviewed TSH low   Decrease synthroid to 150mcg   Increase mounjaro  to 7.5mg  q week can call to increase in 64m   RTC 33m with labs   Erick Isles MD

## 2024-10-29 ENCOUNTER — Telehealth (HOSPITAL_BASED_OUTPATIENT_CLINIC_OR_DEPARTMENT_OTHER): Payer: Self-pay | Admitting: Internal Medicine

## 2024-10-29 MED ORDER — OZEMPIC 1 MG/DOSE (4 MG/3 ML) SUBCUTANEOUS PEN INJECTOR: 1.0000 mg | PEN_INJECTOR | SUBCUTANEOUS | 5 refills | Status: AC

## 2024-10-29 NOTE — Telephone Encounter (Signed)
 Patients insurance wont pay for Mounjaro . She also wants to talk to you because she has gained 50lbs while being on the Mounjaro  [phone].

## 2025-02-26 ENCOUNTER — Ambulatory Visit (HOSPITAL_BASED_OUTPATIENT_CLINIC_OR_DEPARTMENT_OTHER): Payer: Self-pay | Admitting: Internal Medicine
